# Patient Record
Sex: Male | Born: 1955 | Race: White | Hispanic: No | Marital: Married | Smoking: Never smoker
Health system: Southern US, Community
[De-identification: ages and names within clinical notes are randomized; demographics above are authoritative.]

## PROBLEM LIST (undated history)

## (undated) DIAGNOSIS — I10 Essential (primary) hypertension: Secondary | ICD-10-CM

## (undated) DIAGNOSIS — I2510 Atherosclerotic heart disease of native coronary artery without angina pectoris: Secondary | ICD-10-CM

## (undated) DIAGNOSIS — T4145XA Adverse effect of unspecified anesthetic, initial encounter: Secondary | ICD-10-CM

## (undated) DIAGNOSIS — T8859XA Other complications of anesthesia, initial encounter: Secondary | ICD-10-CM

## (undated) DIAGNOSIS — M1990 Unspecified osteoarthritis, unspecified site: Secondary | ICD-10-CM

## (undated) DIAGNOSIS — E669 Obesity, unspecified: Secondary | ICD-10-CM

## (undated) DIAGNOSIS — I219 Acute myocardial infarction, unspecified: Secondary | ICD-10-CM

## (undated) DIAGNOSIS — R7303 Prediabetes: Secondary | ICD-10-CM

## (undated) DIAGNOSIS — I480 Paroxysmal atrial fibrillation: Secondary | ICD-10-CM

## (undated) DIAGNOSIS — E785 Hyperlipidemia, unspecified: Secondary | ICD-10-CM

## (undated) HISTORY — DX: Paroxysmal atrial fibrillation: I48.0

## (undated) HISTORY — DX: Essential (primary) hypertension: I10

## (undated) HISTORY — DX: Atherosclerotic heart disease of native coronary artery without angina pectoris: I25.10

## (undated) HISTORY — PX: ARTHROSCOPY KNEE W/ DRILLING: SUR92

## (undated) HISTORY — DX: Hyperlipidemia, unspecified: E78.5

## (undated) HISTORY — DX: Obesity, unspecified: E66.9

---

## 2006-02-03 DIAGNOSIS — I219 Acute myocardial infarction, unspecified: Secondary | ICD-10-CM

## 2006-02-03 DIAGNOSIS — I2510 Atherosclerotic heart disease of native coronary artery without angina pectoris: Secondary | ICD-10-CM

## 2006-02-03 HISTORY — DX: Atherosclerotic heart disease of native coronary artery without angina pectoris: I25.10

## 2006-02-03 HISTORY — DX: Acute myocardial infarction, unspecified: I21.9

## 2007-01-19 ENCOUNTER — Inpatient Hospital Stay (HOSPITAL_COMMUNITY): Admission: EM | Admit: 2007-01-19 | Discharge: 2007-01-24 | Payer: Self-pay | Admitting: Emergency Medicine

## 2007-01-20 HISTORY — PX: CARDIAC CATHETERIZATION: SHX172

## 2007-02-04 HISTORY — PX: OTHER SURGICAL HISTORY: SHX169

## 2010-06-18 NOTE — Discharge Summary (Signed)
NAME:  OLUWAFERANMI, WAIN                   ACCOUNT NO.:  [id number]   MEDICAL RECORD NO.:  [id number]          PATIENT TYPE:  INP   LOCATION:  3712                         FACILITY:  [facility]   PHYSICIAN:  Darlin Priestly, MD  DATE OF BIRTH:  08/05/1955   DATE OF ADMISSION:  01/19/2007  DATE OF DISCHARGE:  01/24/2007                               DISCHARGE SUMMARY   Mr. Lyfe Monger is a 55 year old male patient who went to his primary MD  after having two episodes of chest pain on Sunday and Monday, left-  sided.  He also had severe pain in his left shoulder, upper arm, he was  nauseated and shortness of breath and had hot flash sensations.  He was  transferred to [facility] because all his cardiac enzymes were elevated  for further Cardiology workup.  He was seen by Dr. Lynnea Ferrier and admitted,  put on IV heparin with plans for cardiac catheterization.  He underwent  cardiac catheterization on January 20, 2007.  He had a total RCA with  left to right collaterals.  It was thought that he should have an  attempt at PCI.  He also had a 30% LAD.  Dr. Lenise Herald attempted  PCI; however, he was unable to cross the lesion and so no PCI was done.  Medical therapy was recommended.  Over the next following days he was  seen by cardiac rehab.  His medications were adjusted.  He has a couple  of runs of atrial fib, they were not prolonged.  He was then placed on  Cardizem and his metoprolol was increased.  On January 24, 2007, he was  considered stable for discharge home, his blood pressure was 110/80,  heart rate 82, respirations 20, temperature is 98.4, O2 sats were 93%.  His labs showed a hemoglobin of 13.2, hematocrit 38, platelets 271,000,  WBCs 10.  His sodium was 136, potassium 3.8, BUN was 16, creatinine was  1.12, glucose was 117.  CK MB #1 was 279/30.5, #2 was 210/22, #3 was  171/18, #4 was 132/12.6, #5 104/9.3, and #6 was 93/8.  His troponin #1  was 6.52, #2 was 6.81, #3 was 7.25, #4 was 7.05,  #5 was 4.66, #6 was  3.72.  His total cholesterol was 178, LDL was 131, HDL was 29 and  triglycerides were 92.  His glycosylated hemoglobin was 6,  TSH was  2.915.  Chest x-Stacie showed linear scarring vs. subsegmental atelectasis  at the left base, otherwise negative exam.   DISCHARGE MEDICATIONS:  1. Plavix 75 mg a day.  2. Altace 2.5 mg a day.  3. Omega 3 Fish Oil capsules 3 per day.  4. Lipitor 80 mg a day.  5. Metoprolol 50 mg twice per day.  6. Diltiazem ER at 180 mg a day.  7. He should increase his aspirin to 81 mg two a day.  8. Nitroglycerin one under tongue when needed for chest pain.   He was told about cardiac rehab, he plans to attend in [facility]   DISCHARGE DIAGNOSES:  1. Out of hospital  myocardial infarction.  2. Failed attempt at percutaneous coronary intervention to his right      coronary artery which was totaled.  He did have left-to-right      collaterals.  3. Hyperlipidemia.  4. Short episodes of paroxysmal atrial fibrillation.  5. Normal left ventricular function.  6. Hypertension.      [name], N.P.      Darlin Priestly, MD  Electronically Signed    BB/MEDQ  D:  01/24/2007  T:  01/25/2007  Job:  [id number]   cc:   Wannetta Sender.

## 2010-06-18 NOTE — Cardiovascular Report (Signed)
NAME:  Chris Wilcox, Chris Wilcox                   ACCOUNT NO.:  [id number]   MEDICAL RECORD NO.:  [id number]          PATIENT TYPE:  INP   LOCATION:  2924                         FACILITY:  [facility]   PHYSICIAN:  Ritta Slot, MD     DATE OF BIRTH:  [birth year]   DATE OF PROCEDURE:  01/20/2007  DATE OF DISCHARGE:                            CARDIAC CATHETERIZATION   SURGEON:  Ritta Slot, MD.   PROCEDURE:  1. Retrograde left heart catheterization.  2. Left ventriculography.  3. Selective visualization of the coronary arteries.   INDICATIONS FOR PROCEDURE:  This is a 55 year old gentleman with  stuttering left arm pain which persisted until several hours ago. He  came to [facility] emergency room yesterday where he underwent ECG that  showed inferior Q waves with ST segment depression inferolateral and  cardiac enzymes positive for an ACS. Although he remains asymptomatic  and pain free, he was brought to the [facility] cardiac catheterization  laboratory for further delineation of coronary angiography.   He is brought to the second floor of the [facility] hospital to the  cardiac catheterization laboratory. He was prepped and draped in the  usual sterile fashion. The right groin was shaved and clean ready for  access. Approach from the right femoral artery.  I used 20 mL of 1%  lidocaine to infiltrate around the right groin. Sedation used was  4 mg  of Versed and 50 mcg of fentanyl. The right femoral artery was accessed  using a Cook needle and the modified Seldinger technique. A J wire was  was passed Walworth wire into the descending aorta. The Cook needle was  removed over the J wire. A 6-French sheath was placed over the J wire in  the RFA. Over the J wire was into the cook needle and over the J wire  was passed the JL4 catheter. The JL4 catheter was engaged into the left  coronary artery. Visualization of the left coronary artery was taken in  the LAO cranial, LAO caudal, RAO caudal, RAO  cranial view. The JL4  catheter was then exchanged over the J wire for the JL4 catheter. The  JL4 catheter was then engaged into the right coronary artery without  difficulty. The right coronary artery was visualized in the RAO cranial  and LAO cranial views. The JL4 catheter was then exchanged over the J  wire for an angled pigtail. The angled pigtail was placed over the J  wire and traversed across the aortic valve into the left ventricle  without difficulty. Views of the left ventricle were taken in the RAO  oblique 45 degree view with 40 mL of contrast at 13 mL per second.   COMPLICATIONS:  None.   FINDINGS:  Pressures:  Aortic pressure was 108/70/89, LV pressure  104/9/20, PBA was 112/72/92, PDD was 109/13/20. There was no gradient  acoross the Ao Valve by pullback technique.   CORONARY ARTERIES:  1. The left main coronary artery was short and gave rise to an LAD, a      ramus and circumflex. There was no disease  in the left main.  2. The anterior descending artery was large, long and wrapped around      the apex. There was mild disease around 30% stenosis which was      diffuse in the proximal part of the LAD. The LAD gave rise to two      diagonals, there is no disease in the diagonal. The ramus      intermedius branch was free of any disease. The circumflex artery      was medium sized vessel that gave rise to a couple of OMs. There      was no significant disease in the circumflex system. The right      coronary artery had a proximal 100% occlusion and gave rise to the      posterolateral artery. There were collaterals to the right coronary      artery from the LAD.  3. Left ventricular function. LV function was essentially normal with      mild inferoposterior basal hypokinesis. EF was 55%.   IMPRESSION/PLAN:  100% occluded proximal artery with 30% proximal LAD  disease.   PLAN:  The patient will be considered for PCI to the RCA as it is  thought to be an acute recent  infarct although collaterals from left to  right do exist. The films were reviewed and discussed with Dr. Jenne Campus  who agrees to proceed with PCI.      Ritta Slot, MD  Electronically Signed     HS/MEDQ  D:  01/20/2007  T:  01/21/2007  Job:  [id number]

## 2010-06-18 NOTE — Cardiovascular Report (Signed)
NAME:  RYSHAWN, SANZONE                   ACCOUNT NO.:  [id number]   MEDICAL RECORD NO.:  [id number]          PATIENT TYPE:  INP   LOCATION:  2924                         FACILITY:  [facility]   PHYSICIAN:  Darlin Priestly, MD  DATE OF BIRTH:  05/26/1955   DATE OF PROCEDURE:  01/20/2007  DATE OF DISCHARGE:                            CARDIAC CATHETERIZATION   PROCEDURE:  Unsuccessful PCI of the RCA.   SURGEON:  Darlin Priestly, MD   COMPLICATIONS:  None.   INDICATIONS:  Ms. Emberton is a 55 year old male patient with a history of  substernal chest pain which had been going on and off for the last 3  days.  The patient was subsequently seen by his primary care physician  where he wound up having a CK-MB and troponin drawn which revealed a CK  of 370, MB of 48 and a troponin of 10.  He was socially admitted to  [facility] where he underwent cardiac catheterization today by  Dr. Ritta Slot revealing noncritical disease of the proximal LAD  with extensive left-to-right collaterals.  The RCA was noted to be  totally occluded at its mid portion.  He is now for an attempted PCI of  the RCA.   DESCRIPTION OF PROCEDURE:  After obtaining informed written consent,  __________.  A JR-4 6-French guiding catheter with side holes __________  was engaged in the right coronary ostium.  We then attempted to cross  the mid RCA stenotic lesion using a Prowater guidewire, though we were  unsuccessful in passing this beyond the total occlusion.  We then used a  PT 2  Graphix guidewire which was able to successfully cross  approximately 2-3 cm beyond the total occlusion though were unsure  whether this was in the true lumen.  Ultimately this wire was exchanged  for a 0.014 whisper wire which again was able to cross through the total  occlusion though we were uncertain whether this was in the true lumen.  We were unable to visualize any of the distal vessel.  Given the  patient's extensive collateral  bed and asymptomatic, we opted to abandon  the procedure with medical therapy.  IV Integrilin was used throughout  the case. The patient was enrolled in the champion study using p.o.  Plavix versus IV Cangrelor.   CONCLUSION:  1. Unsuccessful percutaneous intervention of the mid RCA total      occlusion.  The patient is noted to have extensive left-to-right      collaterals.  2. Adjuvant use of Integrilin infusion.  3. Enrollment of the champion study (Plavix versus IV Cangrelor).      Darlin Priestly, MD  Electronically Signed     RHM/MEDQ  D:  01/20/2007  T:  01/21/2007  Job:  [id number]

## 2010-06-21 NOTE — Discharge Summary (Signed)
NAME:  Chris Wilcox, Chris Wilcox                   ACCOUNT NO.:  [id number]   MEDICAL RECORD NO.:  [id number]          PATIENT TYPE:  INP   LOCATION:  3712                         FACILITY:  [facility]   PHYSICIAN:  Ritta Slot, MD     DATE OF BIRTH:  1955/08/27   DATE OF ADMISSION:  01/19/2007  DATE OF DISCHARGE:  01/24/2007                               DISCHARGE SUMMARY   HISTORY OF PRESENT ILLNESS:  Chris Wilcox is a 55 year old male who  presented to his primary doctor after he had two episodes of chest pain  several days earlier.  He described it as left-sided chest pain that  felt like an ache.  He also had pain in his left shoulder in the upper  arm.  He was nauseated, dyspneic and had flash sensations. His wife  called and got an appointment with his primary M.D. There he was seen  and blood work showed increased CK of 377, CK-MB was 48.7, troponin was  10.99.  He was transferred to [facility] for further cardiology workup,  thus he had an out of hospital MI, probably on January 17, 2007. He was  seen and admitted and put on IV heparin with plans for cardiac  catheterization. This was performed on January 20, 2007, by Dr.  Lynnea Ferrier. He was found to have a total RCA, 30% in his LAD.  He did have  some left to right collaterals. Dr. Lenise Herald then attempted to  open up the RCA.  This was not able to be done.  Thus, the procedure was  aborted. Medical treatment was recommended including aspirin, Plavix and  beta-blocker.  Over the next several days, his medications were  titrated. He was seen by cardiac rehab. He did have a short run of  atrial fib.  His metoprolol was increased and at the time of discharge  he had no further A-Fib.  On the day of discharge his blood pressure is  110/80, heart rate 82, respirations 20, temperature is 98.4, O2 sats  were 93%.   DISCHARGE MEDICATIONS:  1. Plavix 75 mg a day.  2. Altace 2.5 mg a day.  3. Omega-3 fish oil      capsules, 3 per day.  4. Lipitor 80  mg a day.  5. Metoprolol 50 mg      twice per day.  6. Diltiazem ER 180 mg a day.  7. He should      increase his aspirin to 81 mg, two per day.  8. Nitroglycerin      p.r.n. for chest pain.   DISCHARGE INSTRUCTIONS:  He should do no strenuous activity, lifting,  pushing, pulling for 1 week.  Our office will call with an appointment.  If he has any problems with his groin, he will give our office a call.   LABORATORY STUDIES:  Hemoglobin 13.2, hematocrit 38, platelets were 271,  WBCs were 10, sodium 136, potassium 3.8, BUN 16, creatinine 1.12,  glucose 117, total protein was 5.7, albumin was 2.8, AST was 22, ALT was  28.  CK-MB #1) 279/30, troponin of  6.52. #2) 210/22, troponin of 6.81.  #3) 171/18.4, troponin of 7.25.  #4)  132/12.6,  troponin of 7.05.  #5)  104/7.3, troponin of 4.66, #6) 93/08, troponin of 3.72, total  cholesterol is 178, triglycerides 92, HDL of 29 and LDL was 131.  His  TSH was 2.915.   DIAGNOSTIC STUDIES:  Chest x-Ihan showed linear scarring with  subsegmental atelectasis at the left base, otherwise negative exam.   DISCHARGE DIAGNOSIS:  1. Out of hospital myocardial infarction.  2. Coronary artery disease      status post cath with RCA totaled and a 30%      in his LAD. Attempted to do a PCI, however, this failed. He did      have left-to-right collaterals so he was medically treated.  3.      Normal ejection fraction.  4.  Hyperlipidemia.  5. Hypertension.      6. Episode of paroxysmal atrial fibrillation.      [name], N.P.      Ritta Slot, MD  Electronically Signed    BB/MEDQ  D:  02/22/2007  T:  02/23/2007  Job:  [id number]   cc:   Wannetta Sender.

## 2010-11-08 LAB — CBC
HCT: 35.4 — ABNORMAL LOW
HCT: 35.9 — ABNORMAL LOW
HCT: 36.8 — ABNORMAL LOW
HCT: 38.7 — ABNORMAL LOW
Hemoglobin: 12.7 — ABNORMAL LOW
Hemoglobin: 12.9 — ABNORMAL LOW
Hemoglobin: 13.2
Hemoglobin: 13.5
MCHC: 34.7
MCHC: 34.8
MCHC: 34.9
MCHC: 35.5
MCV: 83.3
MCV: 83.9
MCV: 84.6
MCV: 85.4
MCV: 85.4
Platelets: 192
Platelets: 196
Platelets: 199
Platelets: 214
Platelets: 235
RBC: 4.27
RBC: 4.32
RBC: 4.39
RBC: 4.45
RBC: 4.56
RBC: 4.84
RDW: 13.2
RDW: 13.3
RDW: 13.4
RDW: 13.6
WBC: 10
WBC: 11.4 — ABNORMAL HIGH
WBC: 9.2
WBC: 9.8

## 2010-11-08 LAB — BASIC METABOLIC PANEL
BUN: 15
CO2: 23
CO2: 25
Calcium: 8.3 — ABNORMAL LOW
Calcium: 8.9
Chloride: 103
Chloride: 103
Chloride: 107
Creatinine, Ser: 0.93
Creatinine, Ser: 1.16
GFR calc Af Amer: >60
GFR calc Af Amer: >60
GFR calc Af Amer: >60
GFR calc non Af Amer: >60
GFR calc non Af Amer: >60
Glucose, Bld: 139 — ABNORMAL HIGH
Potassium: 3.8
Potassium: 3.8
Sodium: 135
Sodium: 136

## 2010-11-08 LAB — CARDIAC PANEL(CRET KIN+CKTOT+MB+TROPI)
CK, MB: 12.6 — ABNORMAL HIGH
CK, MB: 18.4 — ABNORMAL HIGH
CK, MB: 22.6 — ABNORMAL HIGH
CK, MB: 30.5 — ABNORMAL HIGH
CK, MB: 9.3 — ABNORMAL HIGH
Relative Index: 10.8 — ABNORMAL HIGH
Relative Index: 10.9 — ABNORMAL HIGH
Relative Index: 8.9 — ABNORMAL HIGH
Relative Index: 9.5 — ABNORMAL HIGH
Total CK: 104
Total CK: 132
Total CK: 171
Total CK: 279 — ABNORMAL HIGH
Troponin I: 3.72
Troponin I: 4.66
Troponin I: 6.52
Troponin I: 7.05
Troponin I: 7.25

## 2010-11-08 LAB — COMPREHENSIVE METABOLIC PANEL
ALT: 29
ALT: 30
AST: 31
AST: 54 — ABNORMAL HIGH
Albumin: 2.8 — ABNORMAL LOW
Albumin: 2.8 — ABNORMAL LOW
Alkaline Phosphatase: 71
Alkaline Phosphatase: 75
BUN: 13
BUN: 16
CO2: 25
Calcium: 8.2 — ABNORMAL LOW
Calcium: 8.3 — ABNORMAL LOW
Chloride: 102
Chloride: 104
Chloride: 105
Creatinine, Ser: 1.02
Creatinine, Ser: 1.06
Creatinine, Ser: 1.09
GFR calc Af Amer: >60
GFR calc non Af Amer: >60
GFR calc non Af Amer: >60
GFR calc non Af Amer: >60
Glucose, Bld: 128 — ABNORMAL HIGH
Potassium: 3.4 — ABNORMAL LOW
Potassium: 3.7
Sodium: 138
Total Bilirubin: 1.1
Total Bilirubin: 1.1
Total Bilirubin: 1.5 — ABNORMAL HIGH
Total Protein: 5.8 — ABNORMAL LOW
Total Protein: 6

## 2010-11-08 LAB — DIFFERENTIAL
Basophils Absolute: 0.1
Basophils Relative: 0
Basophils Relative: 0
Eosinophils Absolute: 0 — ABNORMAL LOW
Eosinophils Absolute: 0.2
Eosinophils Relative: 0
Lymphocytes Relative: 12
Lymphs Abs: 1.5
Lymphs Abs: 1.7
Monocytes Absolute: 0.7
Monocytes Absolute: 1
Monocytes Relative: 7
Monocytes Relative: 8
Neutro Abs: 10.3 — ABNORMAL HIGH
Neutrophils Relative %: 80 — ABNORMAL HIGH

## 2010-11-08 LAB — TSH: TSH: 2.915

## 2010-11-08 LAB — I-STAT 8, (EC8 V) (CONVERTED LAB)
Acid-Base Excess: 1
BUN: 20
Bicarbonate: 25.9 — ABNORMAL HIGH
Chloride: 105
HCT: 43
Hemoglobin: 14.6
Potassium: 3.7
Sodium: 136
TCO2: 27
pCO2, Ven: 39.3 — ABNORMAL LOW
pH, Ven: 7.427 — ABNORMAL HIGH

## 2010-11-08 LAB — HEPARIN LEVEL (UNFRACTIONATED)
Heparin Unfractionated: 0.37
Heparin Unfractionated: 0.46

## 2010-11-08 LAB — POCT I-STAT CREATININE: Creatinine, Ser: 1.1

## 2010-11-08 LAB — POCT CARDIAC MARKERS
CKMB, poc: 8.3
Myoglobin, poc: 86.5
Operator id: 288831

## 2010-11-08 LAB — PROTIME-INR: INR: 1

## 2010-11-08 LAB — HEMOGLOBIN A1C: Mean Plasma Glucose: 136

## 2010-11-08 LAB — LIPID PANEL
HDL: 29 — ABNORMAL LOW
LDL Cholesterol: 131 — ABNORMAL HIGH
Total CHOL/HDL Ratio: 6.1

## 2010-11-08 LAB — APTT: aPTT: 35

## 2011-03-07 DIAGNOSIS — I480 Paroxysmal atrial fibrillation: Secondary | ICD-10-CM

## 2011-03-07 HISTORY — DX: Paroxysmal atrial fibrillation: I48.0

## 2011-03-13 HISTORY — PX: NM MYOCAR PERF WALL MOTION: HXRAD629

## 2011-06-10 HISTORY — PX: DOPPLER ECHOCARDIOGRAPHY: SHX263

## 2012-07-12 ENCOUNTER — Other Ambulatory Visit: Payer: Self-pay | Admitting: Cardiology

## 2012-10-01 DIAGNOSIS — R319 Hematuria, unspecified: Secondary | ICD-10-CM | POA: Insufficient documentation

## 2012-10-10 ENCOUNTER — Encounter: Payer: Self-pay | Admitting: *Deleted

## 2012-10-21 ENCOUNTER — Ambulatory Visit (INDEPENDENT_AMBULATORY_CARE_PROVIDER_SITE_OTHER): Payer: BC Managed Care – PPO | Admitting: Cardiology

## 2012-10-21 ENCOUNTER — Encounter: Payer: Self-pay | Admitting: Cardiology

## 2012-10-21 VITALS — BP 130/80 | HR 71 | Ht 72.0 in | Wt 230.0 lb

## 2012-10-21 DIAGNOSIS — I4891 Unspecified atrial fibrillation: Secondary | ICD-10-CM

## 2012-10-21 DIAGNOSIS — E1169 Type 2 diabetes mellitus with other specified complication: Secondary | ICD-10-CM | POA: Insufficient documentation

## 2012-10-21 DIAGNOSIS — E669 Obesity, unspecified: Secondary | ICD-10-CM | POA: Insufficient documentation

## 2012-10-21 DIAGNOSIS — I219 Acute myocardial infarction, unspecified: Secondary | ICD-10-CM

## 2012-10-21 DIAGNOSIS — E785 Hyperlipidemia, unspecified: Secondary | ICD-10-CM

## 2012-10-21 DIAGNOSIS — I10 Essential (primary) hypertension: Secondary | ICD-10-CM

## 2012-10-21 DIAGNOSIS — Z01810 Encounter for preprocedural cardiovascular examination: Secondary | ICD-10-CM

## 2012-10-21 DIAGNOSIS — I4821 Permanent atrial fibrillation: Secondary | ICD-10-CM | POA: Insufficient documentation

## 2012-10-21 DIAGNOSIS — I2510 Atherosclerotic heart disease of native coronary artery without angina pectoris: Secondary | ICD-10-CM | POA: Insufficient documentation

## 2012-10-21 NOTE — Assessment & Plan Note (Signed)
Chris Wilcox has known coronary disease but is not revascularized or revascularizable.  He is currently asymptomatic with no anginal symptoms or heart failure symptoms.  His blood pressure is well-controlled and he is nondiabetic/not on insulin.  PREOPERATIVE CARDIAC RISK ASSESSMENT   Revised Cardiac Risk Index:  High Risk Surgery: no; knee surgery  Defined as Intraperitoneal, intrathoracic or suprainguinal vascular  Active CAD: no; known occlusion of the RCA but no symptoms  CHF: no; no PND orthopnea or edema  Cerebrovascular Disease: no;   Diabetes: borderline; On Insulin: no -- only counts as a risk that 2 if on insulin  CKD (Cr >~ 2): no;  Total: 1 Estimated Risk of Adverse Outcome: LOW RISK  Estimated Risk of MI, PE, VF/VT (Cardiac Arrest), Complete Heart Block: <0.9 % --> reduced to less than 0.5% with the use of long-term beta blocker.   ACC/AHA Guidelines for "Clearance":  Step 1 - Need for Emergency Surgery: No:   If Yes - go straight to OR with perioperative surveillance  Step 2 - Active Cardiac Conditions (Unstable Angina, Decompensated HF, Significant  Arrhytmias - Complete HB, Mobitz II, Symptomatic VT or SVT, Severe Aortic Stenosis - mean gradient > 40 mmHg, Valve area < 1.0 cm2):   No: No active symptoms is not atrial fibrillation.  If Yes - Evaluate & Treat per ACC/AHA Guidelines  Step 3 -  Low Risk Surgery: Yes  If Yes --> proceed to OR  If No --> Step 4  Step 4 - Functional Capacity >= 4 METS without symptoms: Yes  If Yes --> proceed to OR  If No --> Step 5     Proceed to OR with HR control, and no further cardiac evaluation Based on these guidelines and risk calculator his: He would be a low risk patient for a low risk procedure.  I would recommend continuing the beta blocker and calcium blocker perioperatively to avoid tachycardia/rapid atrial fibrillation.  He would need to hold Xarelto for 3 days preoperatively and restart within the first day  if not the next day postop.

## 2012-10-21 NOTE — Assessment & Plan Note (Addendum)
Relatively well controlled at the upper end of normal for a diabetic.  He is on a good dose of ARB beta blocker and calcium channel blocker.

## 2012-10-21 NOTE — Assessment & Plan Note (Signed)
No active anginal symptoms.  His been doing relatively well since his diagnosis back in 2008.  He is stable on low-dose aspirin, beta blocker and ARB.  He is also on statin for risk modification.

## 2012-10-21 NOTE — Progress Notes (Signed)
PCP: Lindwood Qua, MD  Clinic Note: Chief Complaint  Patient presents with  . pre op clearance    need surgery on left knee, no chest pain , no sob , no swelling    HPI: Chris Wilcox is a 57 y.o. male with a PMH below who presents today for early followup for preoperative risk evaluation for knee surgery. He is a very pleasant gentleman who I last saw back in January of this year.  He had a history of acute coronary syndrome doesn't 8 and was found to have a totally occluded RCA with an unsuccessful attempt for PCI.  There were left to right collaterals.  This is essentially confirmed on stress test in February 2013 which showed a moderate perfusion defect in the basal inferior admitted for a wall with minimal reversibility to be consistent with occluded RCA with collateral flow. He also has chronic/recurrent paroxysmal atrial fibrillation with a CHADS2-VASC2 score of 2.  He is in the toilet on Xarelto and is rate controlled with combination of beta blocker and calcium channel blocker.  He is relatively asymptomatic of his atrial fibrillation.  Interval History: He presents today for preoperative evaluation for planned knee surgery on his left knee.  This has limited his ability to exercise and has become quite painful.  Dr. Despina Hick planned operative November.  I felt that it had been too long since I last seen him in her to accurately assess his clinical status. He denies any sensation of palpitations or rapid heartbeats.  He denies any chest pain/pressure or shortness of breath with rest or exertion.  No PND, orthopnea or edema.  No melena, hematochezia or hematuria.  No lightheadedness/dizziness, wooziness, 60/near-syncope.  No TIA or amaurosis fugax symptoms.  No nosebleeds.  He is able to do most activities without any symptoms, only being limited by his knee pain now.  He is easily able to do more than 8 metabolic once once his knee is better.  Past Medical History  Diagnosis Date  .  Coronary artery disease, occlusive 2008    CATH -- CTO OF RCA - unable to cross for PCI; L-R collateralization.   Marland Kitchen PAF (paroxysmal atrial fibrillation) 03/13/2011-04/11/2011    ATRIAL FIB wore Event MONITOR;  with CHADS/VASC score of 2 on anticoagulation  . Hypertension     well controlled  . Hyperlipidemia     on statin montiored by PCP  . Obesity     Prior Cardiac Evaluation and Past Surgical History: Past Surgical History  Procedure Laterality Date  . Doppler echocardiography  06/10/2011    EF =>55% -ATRIAL FIB,BORDERLINE BILEAFLET MITRAL VALVE PROLAPSE,MILD TO MOD MITARL INSUFF;MILD AORTIC INSUFF  . Nm myocar perf wall motion  03/13/2011    LEXISCAN--EF 55% LV MILDLY REDUCED;BASAL INFERIOR AND MID INFERIOR SCAR  . Cardiac catheterization  01/20/2007    unsuccessful PCI of RCA    Allergies  Allergen Reactions  . Codeine Nausea Only    Current Outpatient Prescriptions  Medication Sig Dispense Refill  . aspirin EC 81 MG tablet Take 81 mg by mouth daily.      Marland Kitchen atorvastatin (LIPITOR) 40 MG tablet Take 40 mg by mouth daily.      Marland Kitchen diltiazem (DILT-XR) 240 MG 24 hr capsule Take 240 mg by mouth daily.      . Glucosamine 500 MG CAPS Take by mouth. 2 tablet      . losartan (COZAAR) 50 MG tablet Take 50 mg by mouth daily.      Marland Kitchen  metoprolol (LOPRESSOR) 50 MG tablet Take 1/2 tablet by mouth twice a day      . naproxen sodium (ANAPROX) 220 MG tablet Take 4 tablets by mouth a day      . nitroGLYCERIN (NITROSTAT) 0.4 MG SL tablet Place 0.4 mg under the tongue every 5 (five) minutes as needed for chest pain.      . Omega-3 Fatty Acids (FISH OIL) 1000 MG CAPS Take by mouth. Take 3 capsules by mouth a day      . XARELTO 20 MG TABS TAKE 1 TABLET BY MOUTH EVERY DAY  30 tablet  5   No current facility-administered medications for this visit.    History   Social History Narrative   Married father of 2   Until his knee started giving him problems he was exercising at least 15-20 minutes a  day 3-4 times a week.  Currently now is really limited right knee pain.  He is able to ride the bicycle for about 10-15 minutes and then needs to stop.   He denies any smoking or drinking history   ROS: A comprehensive Review of Systems - Endocrine ROS: He is very concerned he had elevated blood sugar levels and hemoglobin A1c of 6.7.  This is borderline diabetes for him.  With this in mind he is very dramatic changes to his diet.  He has reduced his carbohydrate and fat intake in the last couple weeks he has lost 4 pounds.  Musculoskeletal ROS: positive for - joint pain, joint stiffness and joint swelling - left knee  PHYSICAL EXAM BP 130/80  Pulse 71  Ht 6' (1.829 m)  Wt 230 lb (104.327 kg)  BMI 31.19 kg/m2 General appearance: alert, cooperative, appears stated age, no distress, mildly obese and Well-nourished and well-groomed.  Answers questions appropriately. Neck: no adenopathy, no carotid bruit, no JVD and supple, symmetrical, trachea midline Lungs: clear to auscultation bilaterally, normal percussion bilaterally and Nonlabored with good air movement Heart: irregularly irregular rhythm, S1, S2 normal, no S3 or S4 and No murmurs or rubs Abdomen: soft, non-tender; bowel sounds normal; no masses,  no organomegaly Extremities: extremities normal, atraumatic, no cyanosis or edema, no edema, redness or tenderness in the calves or thighs, no ulcers, gangrene or trophic changes and Tenderness along the left knee Pulses: 2+ and symmetric Neurologic: Alert and oriented X 3, normal strength and tone. Normal symmetric reflexes. Normal coordination and gait Mental status: Alert, oriented, thought content appropriate HEENT: [place]/AT, EOMI, MMM, anicteric sclera  [id number] today: Yes Rate: 71 , Rhythm: Atrial fibrillation, septal MI, age undetermined.  No significant change.  Recent Labs: November 2013: TC 143, TG 71, HDL 36, LDL 93. -->  Due for recheck in the next couple months.  ASSESSMENT /  PLAN: Coronary artery disease, occlusive - Knonw RCA Occlusion No active anginal symptoms.  His been doing relatively well since his diagnosis back in 2008.  He is stable on low-dose aspirin, beta blocker and ARB.  He is also on statin for risk modification.  AF (paroxysmal atrial fibrillation) Rate controlled with beta blocker and calcium channel blocker - he is unaware of his symptoms within normal heart rate.  He is on Xarelto for regulation.  HTN (hypertension), benign Relatively well controlled at the upper end of normal for a diabetic.  He is on a good dose of ARB beta blocker and calcium channel blocker.  Obesity (BMI 30-39.9) With his diagnosis of borderline diabetes which basically gives him the diagnosis of metabolic syndrome,  he has made significant changes in his dietary intake.  He is also looking forward to getting back into exercising once his knee is better.  He wants to lose weight, her blood bili, some his medications and not go on an additional medication for diabetes.  Dyslipidemia, goal LDL below 70 He is on statin and followed by his primary physician.  His last labs were quite good, but not yet at goal his HDL is 36 (which is a portion of the middle syndrome diagnosis) and his LDL is not quite helping and 93 with a goal of less than 70.  He is due to get his labs rechecked soon.  We could consider additional medication such as niacin to help with his HDL, I think the best plan for a steel improvement is daily exercise.  Pre-operative cardiovascular examination Mr. Linhardt has known coronary disease but is not revascularized or revascularizable.  He is currently asymptomatic with no anginal symptoms or heart failure symptoms.  His blood pressure is well-controlled and he is nondiabetic/not on insulin.  PREOPERATIVE CARDIAC RISK ASSESSMENT   Revised Cardiac Risk Index:  High Risk Surgery: no; knee surgery  Defined as Intraperitoneal, intrathoracic or suprainguinal  vascular  Active CAD: no; known occlusion of the RCA but no symptoms  CHF: no; no PND orthopnea or edema  Cerebrovascular Disease: no;   Diabetes: borderline; On Insulin: no -- only counts as a risk that 2 if on insulin  CKD (Cr >~ 2): no;  Total: 1 Estimated Risk of Adverse Outcome: LOW RISK  Estimated Risk of MI, PE, VF/VT (Cardiac Arrest), Complete Heart Block: <0.9 % --> reduced to less than 0.5% with the use of long-term beta blocker.   ACC/AHA Guidelines for "Clearance":  Step 1 - Need for Emergency Surgery: No:   If Yes - go straight to OR with perioperative surveillance  Step 2 - Active Cardiac Conditions (Unstable Angina, Decompensated HF, Significant  Arrhytmias - Complete HB, Mobitz II, Symptomatic VT or SVT, Severe Aortic Stenosis - mean gradient > 40 mmHg, Valve area < 1.0 cm2):   No: No active symptoms is not atrial fibrillation.  If Yes - Evaluate & Treat per ACC/AHA Guidelines  Step 3 -  Low Risk Surgery: Yes  If Yes --> proceed to OR  If No --> Step 4  Step 4 - Functional Capacity >= 4 METS without symptoms: Yes  If Yes --> proceed to OR  If No --> Step 5     Proceed to OR with HR control, and no further cardiac evaluation Based on these guidelines and risk calculator his: He would be a low risk patient for a low risk procedure.  I would recommend continuing the beta blocker and calcium blocker perioperatively to avoid tachycardia/rapid atrial fibrillation.  He would need to hold Xarelto for 3 days preoperatively and restart within the first day if not the next day postop.     Orders Placed This Encounter  Procedures  . EKG 12-Lead   Meds ordered this encounter  Medications  . nitroGLYCERIN (NITROSTAT) 0.4 MG SL tablet    Sig: Place 0.4 mg under the tongue every 5 (five) minutes as needed for chest pain.    Followup: January to February 2015  [place] Sloop W. Herbie Baltimore, M.D., M.S. [facility] [address]  [address] [place], [place]  [address]  [phone] Pager # [phone]

## 2012-10-21 NOTE — Assessment & Plan Note (Signed)
He is on statin and followed by his primary physician.  His last labs were quite good, but not yet at goal his HDL is 36 (which is a portion of the middle syndrome diagnosis) and his LDL is not quite helping and 93 with a goal of less than 70.  He is due to get his labs rechecked soon.  We could consider additional medication such as niacin to help with his HDL, I think the best plan for a steel improvement is daily exercise.

## 2012-10-21 NOTE — Assessment & Plan Note (Signed)
Rate controlled with beta blocker and calcium channel blocker - he is unaware of his symptoms within normal heart rate.  He is on Xarelto for regulation.

## 2012-10-21 NOTE — Assessment & Plan Note (Signed)
With his diagnosis of borderline diabetes which basically gives him the diagnosis of metabolic syndrome, he has made significant changes in his dietary intake.  He is also looking forward to getting back into exercising once his knee is better.  He wants to lose weight, her blood bili, some his medications and not go on an additional medication for diabetes.

## 2012-10-21 NOTE — Patient Instructions (Signed)
As we discussed, you would be Low Risk from a Cardiac Perspective -- due to your known Coronary Artery Disease.  The Xarelto can be held 2-3 days pre-op & restart 1-2 days post-op; This is taken daily with the evening meal.  Aspirin needs to be held 5 days pre-op & can start that evening or the next day.  I will see you back in January-February time frame to see how you are doing post-operatively & to get back into the usual routine.  Marykay Lex, MD

## 2012-11-25 NOTE — Progress Notes (Signed)
Need orders when able please - pt coming for preop FRI 12/03/12 - thank you 

## 2012-11-28 ENCOUNTER — Other Ambulatory Visit: Payer: Self-pay | Admitting: Orthopedic Surgery

## 2012-11-29 ENCOUNTER — Encounter (HOSPITAL_COMMUNITY): Payer: Self-pay | Admitting: Pharmacy Technician

## 2012-12-02 ENCOUNTER — Other Ambulatory Visit (HOSPITAL_COMMUNITY): Payer: Self-pay | Admitting: Orthopedic Surgery

## 2012-12-02 NOTE — Patient Instructions (Addendum)
[address] Harcum  12/02/2012   Your procedure is scheduled on: 12-13-2012  Report to [facility] at 600 AM.  Call this number if you have problems the morning of surgery [phone]   Remember: short stay will draw your blood type morning of surgery.   Do not eat food or drink liquids :After Midnight.    Take these medicines the morning of surgery with A SIP OF WATER: diltiazem, metoprolol                                SEE [facility] PREPARING FOR SURGERY SHEET             You may not have any metal on your body including hair pins and piercings  Do not wear jewelry, make-up.  Do not wear lotions, powders, or perfumes. You may wear deodorant.   Men may shave face and neck.  Do not bring valuables to the hospital. [facility] IS NOT RESPONSIBLE FOR VALUEABLES.  Contacts, dentures or bridgework may not be worn into surgery.  Leave suitcase in the car. After surgery it may be brought to your room.  For patients admitted to the hospital, checkout time is 11:00 AM the day of discharge.   Patients discharged the day of surgery will not be allowed to drive home.  Name and phone number of your driver:  Special Instructions: N/A   Please read over the following fact sheets that you were given: mrsa information, blood fact sheet, incentive spirometer sheet  Call Cain Sieve RN pre op nurse if needed [phone]    FAILURE TO FOLLOW THESE INSTRUCTIONS MAY RESULT IN THE CANCELLATION OF YOUR SURGERY.  PATIENT SIGNATURE___________________________________________  NURSE SIGNATURE_____________________________________________

## 2012-12-02 NOTE — Progress Notes (Signed)
Cardiac clearance note dr Herbie Baltimore 10-21-12 epic

## 2012-12-03 ENCOUNTER — Encounter (HOSPITAL_COMMUNITY): Payer: Self-pay

## 2012-12-03 ENCOUNTER — Encounter (HOSPITAL_COMMUNITY)
Admission: RE | Admit: 2012-12-03 | Discharge: 2012-12-03 | Disposition: A | Discharge status: Home / Self Care | Payer: BC Managed Care – PPO | Source: Ambulatory Visit | Attending: Orthopedic Surgery | Admitting: Orthopedic Surgery

## 2012-12-03 ENCOUNTER — Ambulatory Visit (HOSPITAL_COMMUNITY)
Admission: RE | Admit: 2012-12-03 | Discharge: 2012-12-03 | Disposition: A | Discharge status: Home / Self Care | Payer: BC Managed Care – PPO | Source: Ambulatory Visit | Attending: Orthopedic Surgery | Admitting: Orthopedic Surgery

## 2012-12-03 ENCOUNTER — Encounter: Payer: Self-pay | Admitting: Cardiology

## 2012-12-03 DIAGNOSIS — Z01818 Encounter for other preprocedural examination: Secondary | ICD-10-CM | POA: Insufficient documentation

## 2012-12-03 DIAGNOSIS — I10 Essential (primary) hypertension: Secondary | ICD-10-CM | POA: Insufficient documentation

## 2012-12-03 DIAGNOSIS — Z01812 Encounter for preprocedural laboratory examination: Secondary | ICD-10-CM | POA: Insufficient documentation

## 2012-12-03 DIAGNOSIS — IMO0002 Reserved for concepts with insufficient information to code with codable children: Secondary | ICD-10-CM | POA: Insufficient documentation

## 2012-12-03 DIAGNOSIS — M1710 Unilateral primary osteoarthritis, unspecified knee: Secondary | ICD-10-CM | POA: Insufficient documentation

## 2012-12-03 HISTORY — DX: Adverse effect of unspecified anesthetic, initial encounter: T41.45XA

## 2012-12-03 HISTORY — DX: Other complications of anesthesia, initial encounter: T88.59XA

## 2012-12-03 HISTORY — DX: Unspecified osteoarthritis, unspecified site: M19.90

## 2012-12-03 HISTORY — DX: Acute myocardial infarction, unspecified: I21.9

## 2012-12-03 HISTORY — DX: Prediabetes: R73.03

## 2012-12-03 LAB — COMPREHENSIVE METABOLIC PANEL
ALT: 19 U/L (ref 0–53)
Alkaline Phosphatase: 93 U/L (ref 39–117)
BUN: 26 mg/dL — ABNORMAL HIGH (ref 6–23)
CO2: 27 mEq/L (ref 19–32)
Chloride: 102 mEq/L (ref 96–112)
GFR calc Af Amer: 85 mL/min — ABNORMAL LOW (ref >90)
Glucose, Bld: 126 mg/dL — ABNORMAL HIGH (ref 70–99)
Potassium: 3.8 mEq/L (ref 3.5–5.1)
Total Bilirubin: 0.8 mg/dL (ref 0.3–1.2)

## 2012-12-03 LAB — URINALYSIS, ROUTINE W REFLEX MICROSCOPIC
Bilirubin Urine: NEGATIVE
Glucose, UA: NEGATIVE mg/dL
Nitrite: NEGATIVE
Protein, ur: NEGATIVE mg/dL
Urobilinogen, UA: 1 mg/dL (ref 0.0–1.0)
pH: 6.5 (ref 5.0–8.0)

## 2012-12-03 LAB — APTT: aPTT: 31 seconds (ref 24–37)

## 2012-12-03 LAB — CBC
HCT: 42.6 % (ref 39.0–52.0)
Hemoglobin: 15.4 g/dL (ref 13.0–17.0)
MCHC: 36.2 g/dL — ABNORMAL HIGH (ref 30.0–36.0)
RBC: 5.01 MIL/uL (ref 4.22–5.81)
WBC: 7.1 10*3/uL (ref 4.0–10.5)

## 2012-12-03 LAB — PROTIME-INR: Prothrombin Time: 15 seconds (ref 11.6–15.2)

## 2012-12-03 LAB — SURGICAL PCR SCREEN
MRSA, PCR: INVALID — AB
Staphylococcus aureus: INVALID — AB

## 2012-12-03 LAB — URINE MICROSCOPIC-ADD ON

## 2012-12-03 NOTE — Progress Notes (Signed)
Dr Herbie Baltimore office unable to find 10-21-12 ekg and not in epic, ekg repeated for 12-03-12 pre op visit.

## 2012-12-03 NOTE — Progress Notes (Signed)
Ua, micro, cmet results faxed to dr Lequita Halt by epic

## 2012-12-06 LAB — MRSA CULTURE

## 2012-12-12 ENCOUNTER — Encounter (HOSPITAL_COMMUNITY): Payer: Self-pay | Admitting: Anesthesiology

## 2012-12-12 ENCOUNTER — Other Ambulatory Visit: Payer: Self-pay | Admitting: Orthopedic Surgery

## 2012-12-12 NOTE — H&P (Signed)
Chris Wilcox   DOB: [birth year]  Married / Language: English / Race: White  Male  Date of Admission: 12/13/2012   Chief Complaint: Left Knee Pain   History of Present Illness  The patient is a 57 year old male who comes in for a preoperative History and Physical. The patient is scheduled for a left total knee arthroplasty to be performed by Dr. Gus Rankin. Aluisio, MD at [facility] on 12/13/2012.  The patient is a 57 year old male who presents today for follow up of their knee. The patient is being followed for their left knee pain and osteoarthritis. They are now out from the last visit. Symptoms reported today include: pain. He wants to proceed with surgery.  The knee is bothering him at all times. It is limiting what he can and can not do. The knee wants to give out on him at times. He will get sharp pains in the knee. He has some right knee pain but no where near as bad as the left. He is not interested in injections as he wants a more permanent fix of this and also because his function is the thing that is really worse than anything right now and the injection will not help that. He is ready to proceed with the knee replacement.  They have been treated conservatively in the past for the above stated problem and despite conservative measures, they continue to have progressive pain and severe functional limitations and dysfunction. They have failed non-operative management including home exercise, medications, and injections. It is felt that they would benefit from undergoing total joint replacement. Risks and benefits of the procedure have been discussed with the patient and they elect to proceed with surgery. There are no active contraindications to surgery such as ongoing infection or rapidly progressive neurological disease.   Problem List  Primary osteoarthritis of one knee (715.16)   Allergies  Codeine Derivatives. Nausea.   Medications:  Aspirin 81 mg  Atorvastatin 40  mg Diltiazem XR 240 mg Glucosamine 500 mg Losartan 50 mg Metoprolol 50 mg Naproxen 220 mg Nitroglycerin 0.4 mg SL Omega 3 Fish Oil 1000 mg Xarelto 20 mg  Family History  Depression. First Degree Relatives. father  Rheumatoid Arthritis. sister  Diabetes Mellitus. father and grandfather fathers side  Cancer. 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 greater than 5  Marital status. married  Illicit drug use. no  Living situation. live with spouse  Pain Contract. no  Tobacco use. Never smoker. never smoker  Previously in rehab. no  Exercise. Exercises daily; does team sport  Current work status. working full time  Drug/Alcohol Rehab (Currently). no  Children. 2  Alcohol use. never consumed alcohol  Past Surgical History  Arthroscopy of Knee. left   Medical History  Coronary artery disease  Myocardial infarction. 2008  Hypertension  Atrial Fibrillation. Chronic  Hyperlipidemia. Dyslipidemia  Cardiac Arrhythmia   Review of Systems  General: Not Present- Chills, Fever, Night Sweats, Fatigue, Weight Gain, Weight Loss and Memory Loss.  Skin: Not Present- Hives, Itching, Rash, Eczema and Lesions.  HEENT: Not Present- Tinnitus, Headache, Double Vision, Visual Loss, Hearing Loss and Dentures.  Respiratory: Not Present- Shortness of breath with exertion, Shortness of breath at rest, Allergies, Coughing up blood and Chronic Cough.  Cardiovascular: Not Present- Chest Pain, Racing/skipping heartbeats, Difficulty Breathing Lying Down, Murmur, Swelling and Palpitations.  Gastrointestinal: Not  Present- Bloody Stool, Heartburn, Abdominal Pain, Vomiting, Nausea, Constipation, Diarrhea, Difficulty Swallowing, Jaundice and Loss of appetitie.  Male Genitourinary: Not Present- Urinary frequency, Blood in Urine, Weak urinary stream, Discharge, Flank Pain, Incontinence, Painful  Urination, Urgency, Urinary Retention and Urinating at Night.  Musculoskeletal: Not Present- Muscle Weakness, Muscle Pain, Joint Swelling, Joint Pain, Back Pain, Morning Stiffness and Spasms.  Neurological: Not Present- Tremor, Dizziness, Blackout spells, Paralysis, Difficulty with balance and Weakness.  Psychiatric: Not Present- Insomnia.   Vitals  Weight: 225 lb Height: 72 in  Weight was reported by patient.  Height was reported by patient.  Body Surface Area: 2.28 m Body Mass Index: 30.52 kg/m  Pulse: 72 (Regular) Resp.: 12 (Unlabored)  BP: 146/82 (Sitting, Right Arm, Standard)   Physical Exam  The physical exam findings are as follows:  General  Mental Status - Alert, cooperative and good historian. General Appearance - pleasant. Not in acute distress. Orientation - Oriented X3. Build & Nutrition - Well nourished and Well developed.  Head and Neck  Head - normocephalic, atraumatic .  Neck  Global Assessment - supple. no bruit auscultated on the right and no bruit auscultated on the left.  partial lower denture  Eye  Vision - Wears corrective lenses (readers only). Pupil - Bilateral - Regular and Round.  Motion - Bilateral - EOMI.  Chest and Lung Exam  Auscultation:  Breath sounds: - clear at anterior chest wall and - clear at posterior chest wall.  Adventitious sounds: - No Adventitious sounds.  Cardiovascular  Auscultation: Rhythm - Regular rate and rhythm. Heart Sounds - S1 WNL and S2 WNL.  Murmurs & Other Heart Sounds: Auscultation of the heart reveals - No Murmurs.  Abdomen  Palpation/Percussion: Tenderness - Abdomen is non-tender to palpation. Rigidity (guarding) - Abdomen is soft.  Auscultation: Auscultation of the abdomen reveals - Bowel sounds normal.  Musculoskeletal  On exam he is a well developed male alert and oriented in no apparent distress. His hips show normal range of motion with no discomfort. Right knee exam 0 to 135, no tenderness or instability. The left  knee no effusion. Range 5 to 120. Moderate crepitus on range of motion with tenderness medial greater than lateral and no instability.   RADIOGRAPHS:  AP both knees and lateral show that there is endstage arthritis of both knees. All bone on bone.   Assessment & Plan  Primary osteoarthritis of one knee (715.16)  Impression: Left Knee   Note: Plan is for aby Dr. Lequita Halt.   Plan is to go home.   PCP - Dr. Lenox Ahr  Cards - Dr. Bryan Lemma - Patient has been seen preoperatively and felt to be stable for surgery.  He was instructed to stop the Xarelto 3 days prior to surgery and stop the Aspirin 5 days prior to surgery.  Both will be started back postop at the request of his cardiologist.  He will remain on his metoprolol.  The patient will not receive TXA (tranexamic acid) due to: Known Coronary Heart Disease.   Signed electronically by Lauraine Rinne, III PA-C

## 2012-12-12 NOTE — Anesthesia Preprocedure Evaluation (Addendum)
Anesthesia Evaluation  Patient identified by MRN, date of birth, ID band Patient awake    Reviewed: Allergy & Precautions, H&P , NPO status , Patient's Chart, lab work & pertinent test results  History of Anesthesia Complications (+) history of anesthetic complications  Airway Mallampati: II TM Distance: >3 FB Neck ROM: Full    Dental no notable dental hx.    Pulmonary neg pulmonary ROS,  breath sounds clear to auscultation  Pulmonary exam normal       Cardiovascular Exercise Tolerance: Good hypertension, Pt. on medications and Pt. on home beta blockers + CAD and + Past MI negative cardio ROS  + dysrhythmias Atrial Fibrillation Rhythm:Regular Rate:Normal  Low risk per preop cardiology exam by Dr. Herbie Baltimore on 10-21-12  ECHO: EF > 55%   Neuro/Psych negative neurological ROS  negative psych ROS   GI/Hepatic negative GI ROS, Neg liver ROS,   Endo/Other  negative endocrine ROS  Renal/GU negative Renal ROS  negative genitourinary   Musculoskeletal negative musculoskeletal ROS (+)   Abdominal (+) + obese,   Peds negative pediatric ROS (+)  Hematology negative hematology ROS (+)   Anesthesia Other Findings   Reproductive/Obstetrics negative OB ROS                         Anesthesia Physical Anesthesia Plan  ASA: III  Anesthesia Plan: Spinal   Post-op Pain Management:    Induction: Intravenous  Airway Management Planned:   Additional Equipment:   Intra-op Plan:   Post-operative Plan:   Informed Consent: I have reviewed the patients History and Physical, chart, labs and discussed the procedure including the risks, benefits and alternatives for the proposed anesthesia with the patient or authorized representative who has indicated his/her understanding and acceptance.   Dental advisory given  Plan Discussed with: CRNA  Anesthesia Plan Comments: (Discussed risks/benefits of spinal  including headache, backache, failure, bleeding, infection, and nerve damage. Patient consents to spinal. Questions answered. Coagulation studies and platelet count acceptable. He has been off Xarelto since 12-09-12. Off ASA for six days.)       Anesthesia Quick Evaluation

## 2012-12-13 ENCOUNTER — Encounter (HOSPITAL_COMMUNITY)
Admission: RE | Disposition: A | Discharge status: Skilled Nursing Facility | Payer: Self-pay | Source: Ambulatory Visit | Attending: Orthopedic Surgery

## 2012-12-13 ENCOUNTER — Encounter (HOSPITAL_COMMUNITY): Payer: Self-pay | Admitting: *Deleted

## 2012-12-13 ENCOUNTER — Ambulatory Visit (HOSPITAL_COMMUNITY): Payer: BC Managed Care – PPO | Admitting: Anesthesiology

## 2012-12-13 ENCOUNTER — Encounter (HOSPITAL_COMMUNITY): Payer: BC Managed Care – PPO | Admitting: Anesthesiology

## 2012-12-13 ENCOUNTER — Inpatient Hospital Stay (HOSPITAL_COMMUNITY)
Admission: RE | Admit: 2012-12-13 | Discharge: 2012-12-16 | DRG: 470 | Disposition: A | Discharge status: Skilled Nursing Facility | Payer: BC Managed Care – PPO | Source: Ambulatory Visit | Attending: Orthopedic Surgery | Admitting: Orthopedic Surgery

## 2012-12-13 DIAGNOSIS — I4891 Unspecified atrial fibrillation: Secondary | ICD-10-CM | POA: Diagnosis present

## 2012-12-13 DIAGNOSIS — I2510 Atherosclerotic heart disease of native coronary artery without angina pectoris: Secondary | ICD-10-CM | POA: Diagnosis present

## 2012-12-13 DIAGNOSIS — Z96652 Presence of left artificial knee joint: Secondary | ICD-10-CM

## 2012-12-13 DIAGNOSIS — E1169 Type 2 diabetes mellitus with other specified complication: Secondary | ICD-10-CM | POA: Diagnosis present

## 2012-12-13 DIAGNOSIS — E785 Hyperlipidemia, unspecified: Secondary | ICD-10-CM | POA: Diagnosis present

## 2012-12-13 DIAGNOSIS — E669 Obesity, unspecified: Secondary | ICD-10-CM | POA: Diagnosis present

## 2012-12-13 DIAGNOSIS — E871 Hypo-osmolality and hyponatremia: Secondary | ICD-10-CM | POA: Diagnosis not present

## 2012-12-13 DIAGNOSIS — Z6830 Body mass index (BMI) 30.0-30.9, adult: Secondary | ICD-10-CM

## 2012-12-13 DIAGNOSIS — I252 Old myocardial infarction: Secondary | ICD-10-CM

## 2012-12-13 DIAGNOSIS — I480 Paroxysmal atrial fibrillation: Secondary | ICD-10-CM

## 2012-12-13 DIAGNOSIS — M1710 Unilateral primary osteoarthritis, unspecified knee: Principal | ICD-10-CM | POA: Diagnosis present

## 2012-12-13 DIAGNOSIS — Z7982 Long term (current) use of aspirin: Secondary | ICD-10-CM

## 2012-12-13 DIAGNOSIS — I4821 Permanent atrial fibrillation: Secondary | ICD-10-CM | POA: Diagnosis present

## 2012-12-13 DIAGNOSIS — M179 Osteoarthritis of knee, unspecified: Secondary | ICD-10-CM | POA: Diagnosis present

## 2012-12-13 DIAGNOSIS — Z79899 Other long term (current) drug therapy: Secondary | ICD-10-CM

## 2012-12-13 DIAGNOSIS — I10 Essential (primary) hypertension: Secondary | ICD-10-CM | POA: Diagnosis present

## 2012-12-13 HISTORY — PX: TOTAL KNEE ARTHROPLASTY: SHX125

## 2012-12-13 LAB — TYPE AND SCREEN: ABO/RH(D): O POS

## 2012-12-13 LAB — GLUCOSE, CAPILLARY
Glucose-Capillary: 167 mg/dL — ABNORMAL HIGH (ref 70–99)
Glucose-Capillary: 184 mg/dL — ABNORMAL HIGH (ref 70–99)

## 2012-12-13 SURGERY — ARTHROPLASTY, KNEE, TOTAL
Anesthesia: Spinal | Site: Knee | Laterality: Left | Wound class: Clean

## 2012-12-13 MED ORDER — PROMETHAZINE HCL 25 MG/ML IJ SOLN: 6.2500 mg | INTRAMUSCULAR | Status: DC | PRN

## 2012-12-13 MED ORDER — MENTHOL 3 MG MT LOZG
1.0000 | LOZENGE | OROMUCOSAL | Status: DC | PRN
  Filled 2012-12-13: qty 9

## 2012-12-13 MED ORDER — PROPOFOL INFUSION 10 MG/ML OPTIME
INTRAVENOUS | Status: DC | PRN
  Administered 2012-12-13: 140 ug/kg/min via INTRAVENOUS

## 2012-12-13 MED ORDER — HYDROMORPHONE HCL PF 1 MG/ML IJ SOLN
0.2500 mg | INTRAMUSCULAR | Status: DC | PRN
Start: 2012-12-13 — End: 2012-12-13

## 2012-12-13 MED ORDER — LOSARTAN POTASSIUM 50 MG PO TABS
50.0000 mg | ORAL_TABLET | Freq: Every day | ORAL | Status: DC
  Administered 2012-12-13 – 2012-12-15 (×3): 50 mg via ORAL
  Filled 2012-12-13 (×4): qty 1

## 2012-12-13 MED ORDER — SODIUM CHLORIDE 0.9 % IV SOLN: INTRAVENOUS | Status: DC

## 2012-12-13 MED ORDER — METHOCARBAMOL 500 MG PO TABS
500.0000 mg | ORAL_TABLET | Freq: Four times a day (QID) | ORAL | Status: DC | PRN
  Administered 2012-12-13 – 2012-12-16 (×7): 500 mg via ORAL
  Filled 2012-12-13 (×7): qty 1

## 2012-12-13 MED ORDER — ONDANSETRON HCL 4 MG/2ML IJ SOLN
4.0000 mg | Freq: Four times a day (QID) | INTRAMUSCULAR | Status: DC | PRN
  Administered 2012-12-15: 09:00:00 4 mg via INTRAVENOUS
  Filled 2012-12-13 (×2): qty 2

## 2012-12-13 MED ORDER — MORPHINE SULFATE 2 MG/ML IJ SOLN
1.0000 mg | INTRAMUSCULAR | Status: DC | PRN
Start: 2012-12-13 — End: 2012-12-16

## 2012-12-13 MED ORDER — PROPOFOL 10 MG/ML IV BOLUS
INTRAVENOUS | Status: DC | PRN
  Administered 2012-12-13: 30 mg via INTRAVENOUS

## 2012-12-13 MED ORDER — ACETAMINOPHEN 500 MG PO TABS
1000.0000 mg | ORAL_TABLET | Freq: Once | ORAL | Status: AC
  Administered 2012-12-13: 1000 mg via ORAL
  Filled 2012-12-13: qty 2

## 2012-12-13 MED ORDER — OXYCODONE HCL 5 MG PO TABS
5.0000 mg | ORAL_TABLET | ORAL | Status: DC | PRN
  Administered 2012-12-13 (×2): 10 mg via ORAL
  Administered 2012-12-13 (×2): 5 mg via ORAL
  Administered 2012-12-14 – 2012-12-15 (×9): 10 mg via ORAL
  Filled 2012-12-13 (×11): qty 2
  Filled 2012-12-13 (×2): qty 1

## 2012-12-13 MED ORDER — ASPIRIN EC 81 MG PO TBEC
81.0000 mg | DELAYED_RELEASE_TABLET | Freq: Every day | ORAL | Status: DC
  Administered 2012-12-14 – 2012-12-16 (×3): 81 mg via ORAL
  Filled 2012-12-13 (×3): qty 1

## 2012-12-13 MED ORDER — DEXAMETHASONE SODIUM PHOSPHATE 10 MG/ML IJ SOLN
10.0000 mg | Freq: Once | INTRAMUSCULAR | Status: AC
  Administered 2012-12-13: 10 mg via INTRAVENOUS

## 2012-12-13 MED ORDER — FENTANYL CITRATE 0.05 MG/ML IJ SOLN
INTRAMUSCULAR | Status: AC
  Filled 2012-12-13: qty 2

## 2012-12-13 MED ORDER — ACETAMINOPHEN 650 MG RE SUPP: 650.0000 mg | Freq: Four times a day (QID) | RECTAL | Status: DC | PRN

## 2012-12-13 MED ORDER — SODIUM CHLORIDE 0.9 % IJ SOLN
INTRAMUSCULAR | Status: DC | PRN
  Administered 2012-12-13: 30 mL via INTRAVENOUS

## 2012-12-13 MED ORDER — BISACODYL 10 MG RE SUPP: 10.0000 mg | Freq: Every day | RECTAL | Status: DC | PRN

## 2012-12-13 MED ORDER — FENTANYL CITRATE 0.05 MG/ML IJ SOLN
25.0000 ug | INTRAMUSCULAR | Status: DC | PRN
  Administered 2012-12-13: 50 ug via INTRAVENOUS

## 2012-12-13 MED ORDER — NITROGLYCERIN 0.4 MG SL SUBL: 0.4000 mg | SUBLINGUAL_TABLET | SUBLINGUAL | Status: DC | PRN

## 2012-12-13 MED ORDER — DILTIAZEM HCL ER 240 MG PO CP24
240.0000 mg | ORAL_CAPSULE | Freq: Every morning | ORAL | Status: DC
  Administered 2012-12-14 – 2012-12-16 (×3): 240 mg via ORAL
  Filled 2012-12-13 (×3): qty 1

## 2012-12-13 MED ORDER — METHOCARBAMOL 100 MG/ML IJ SOLN
500.0000 mg | Freq: Four times a day (QID) | INTRAVENOUS | Status: DC | PRN
  Administered 2012-12-13: 500 mg via INTRAVENOUS
  Filled 2012-12-13: qty 5

## 2012-12-13 MED ORDER — CEFAZOLIN SODIUM-DEXTROSE 2-3 GM-% IV SOLR
2.0000 g | INTRAVENOUS | Status: AC
  Administered 2012-12-13: 2 g via INTRAVENOUS

## 2012-12-13 MED ORDER — 0.9 % SODIUM CHLORIDE (POUR BTL) OPTIME
TOPICAL | Status: DC | PRN
  Administered 2012-12-13: 1000 mL

## 2012-12-13 MED ORDER — SODIUM CHLORIDE 0.9 % IJ SOLN
INTRAMUSCULAR | Status: AC
  Filled 2012-12-13: qty 50

## 2012-12-13 MED ORDER — MIDAZOLAM HCL 5 MG/5ML IJ SOLN
INTRAMUSCULAR | Status: DC | PRN
  Administered 2012-12-13: 2 mg via INTRAVENOUS

## 2012-12-13 MED ORDER — ATORVASTATIN CALCIUM 40 MG PO TABS
40.0000 mg | ORAL_TABLET | Freq: Every evening | ORAL | Status: DC
  Administered 2012-12-13 – 2012-12-15 (×3): 40 mg via ORAL
  Filled 2012-12-13 (×4): qty 1

## 2012-12-13 MED ORDER — TRAMADOL HCL 50 MG PO TABS: 50.0000 mg | ORAL_TABLET | Freq: Four times a day (QID) | ORAL | Status: DC | PRN

## 2012-12-13 MED ORDER — BUPIVACAINE HCL 0.25 % IJ SOLN
INTRAMUSCULAR | Status: DC | PRN
  Administered 2012-12-13: 20 mL

## 2012-12-13 MED ORDER — PHENOL 1.4 % MT LIQD: 1.0000 | OROMUCOSAL | Status: DC | PRN

## 2012-12-13 MED ORDER — ACETAMINOPHEN 500 MG PO TABS
1000.0000 mg | ORAL_TABLET | Freq: Four times a day (QID) | ORAL | Status: AC
  Administered 2012-12-13 – 2012-12-14 (×4): 1000 mg via ORAL
  Filled 2012-12-13 (×4): qty 2

## 2012-12-13 MED ORDER — DIPHENHYDRAMINE HCL 12.5 MG/5ML PO ELIX: 12.5000 mg | ORAL_SOLUTION | ORAL | Status: DC | PRN

## 2012-12-13 MED ORDER — CEFAZOLIN SODIUM-DEXTROSE 2-3 GM-% IV SOLR
2.0000 g | Freq: Four times a day (QID) | INTRAVENOUS | Status: AC
  Administered 2012-12-13 (×2): 2 g via INTRAVENOUS
  Filled 2012-12-13 (×2): qty 50

## 2012-12-13 MED ORDER — DEXAMETHASONE SODIUM PHOSPHATE 10 MG/ML IJ SOLN
10.0000 mg | Freq: Every day | INTRAMUSCULAR | Status: AC
  Filled 2012-12-13: qty 1

## 2012-12-13 MED ORDER — METOPROLOL TARTRATE 25 MG PO TABS
25.0000 mg | ORAL_TABLET | Freq: Two times a day (BID) | ORAL | Status: DC
  Administered 2012-12-13 – 2012-12-16 (×6): 25 mg via ORAL
  Filled 2012-12-13 (×7): qty 1

## 2012-12-13 MED ORDER — FENTANYL CITRATE 0.05 MG/ML IJ SOLN
INTRAMUSCULAR | Status: DC | PRN
  Administered 2012-12-13: 100 ug via INTRAVENOUS

## 2012-12-13 MED ORDER — PHENYLEPHRINE HCL 10 MG/ML IJ SOLN
INTRAMUSCULAR | Status: DC | PRN
  Administered 2012-12-13 (×4): 80 ug via INTRAVENOUS

## 2012-12-13 MED ORDER — ACETAMINOPHEN 325 MG PO TABS: 650.0000 mg | ORAL_TABLET | Freq: Four times a day (QID) | ORAL | Status: DC | PRN

## 2012-12-13 MED ORDER — SODIUM CHLORIDE 0.9 % IV SOLN
10.0000 mg | INTRAVENOUS | Status: DC | PRN
  Administered 2012-12-13: 50 ug/min via INTRAVENOUS

## 2012-12-13 MED ORDER — METOCLOPRAMIDE HCL 10 MG PO TABS: 5.0000 mg | ORAL_TABLET | Freq: Three times a day (TID) | ORAL | Status: DC | PRN

## 2012-12-13 MED ORDER — CEFAZOLIN SODIUM-DEXTROSE 2-3 GM-% IV SOLR
INTRAVENOUS | Status: AC
  Filled 2012-12-13: qty 50

## 2012-12-13 MED ORDER — METOCLOPRAMIDE HCL 5 MG/ML IJ SOLN: 5.0000 mg | Freq: Three times a day (TID) | INTRAMUSCULAR | Status: DC | PRN

## 2012-12-13 MED ORDER — SODIUM CHLORIDE 0.9 % IR SOLN
Status: DC | PRN
  Administered 2012-12-13: 1000 mL

## 2012-12-13 MED ORDER — KETOROLAC TROMETHAMINE 15 MG/ML IJ SOLN: 7.5000 mg | Freq: Four times a day (QID) | INTRAMUSCULAR | Status: AC | PRN

## 2012-12-13 MED ORDER — POTASSIUM CHLORIDE IN NACL 20-0.9 MEQ/L-% IV SOLN
INTRAVENOUS | Status: DC
  Administered 2012-12-13 – 2012-12-14 (×2): via INTRAVENOUS
  Filled 2012-12-13 (×3): qty 1000

## 2012-12-13 MED ORDER — BUPIVACAINE LIPOSOME 1.3 % IJ SUSP
INTRAMUSCULAR | Status: DC | PRN
  Administered 2012-12-13: 20 mL

## 2012-12-13 MED ORDER — ONDANSETRON HCL 4 MG/2ML IJ SOLN
INTRAMUSCULAR | Status: DC | PRN
  Administered 2012-12-13: 4 mg via INTRAVENOUS

## 2012-12-13 MED ORDER — DEXAMETHASONE 6 MG PO TABS
10.0000 mg | ORAL_TABLET | Freq: Every day | ORAL | Status: AC
  Administered 2012-12-14: 10:00:00 10 mg via ORAL
  Filled 2012-12-13: qty 1

## 2012-12-13 MED ORDER — BUPIVACAINE HCL (PF) 0.25 % IJ SOLN
INTRAMUSCULAR | Status: AC
  Filled 2012-12-13: qty 30

## 2012-12-13 MED ORDER — BUPIVACAINE LIPOSOME 1.3 % IJ SUSP
20.0000 mL | Freq: Once | INTRAMUSCULAR | Status: DC
  Filled 2012-12-13: qty 20

## 2012-12-13 MED ORDER — LACTATED RINGERS IV SOLN
INTRAVENOUS | Status: DC
  Administered 2012-12-13: 09:00:00 via INTRAVENOUS
  Administered 2012-12-13: 1000 mL via INTRAVENOUS

## 2012-12-13 MED ORDER — FLEET ENEMA 7-19 GM/118ML RE ENEM: 1.0000 | ENEMA | Freq: Once | RECTAL | Status: AC | PRN

## 2012-12-13 MED ORDER — ONDANSETRON HCL 4 MG PO TABS: 4.0000 mg | ORAL_TABLET | Freq: Four times a day (QID) | ORAL | Status: DC | PRN

## 2012-12-13 MED ORDER — BUPIVACAINE IN DEXTROSE 0.75-8.25 % IT SOLN
INTRATHECAL | Status: DC | PRN
  Administered 2012-12-13: 2 mL via INTRATHECAL

## 2012-12-13 MED ORDER — POLYETHYLENE GLYCOL 3350 17 G PO PACK: 17.0000 g | PACK | Freq: Every day | ORAL | Status: DC | PRN

## 2012-12-13 MED ORDER — RIVAROXABAN 10 MG PO TABS
10.0000 mg | ORAL_TABLET | Freq: Every day | ORAL | Status: DC
  Administered 2012-12-14: 10 mg via ORAL
  Filled 2012-12-13 (×2): qty 1

## 2012-12-13 MED ORDER — DOCUSATE SODIUM 100 MG PO CAPS
100.0000 mg | ORAL_CAPSULE | Freq: Two times a day (BID) | ORAL | Status: DC
Start: 2012-12-13 — End: 2012-12-16
  Administered 2012-12-13 – 2012-12-16 (×6): 100 mg via ORAL

## 2012-12-13 SURGICAL SUPPLY — 56 items
BAG ZIPLOCK 12X15 (MISCELLANEOUS) ×2 IMPLANT
BANDAGE ELASTIC 6 VELCRO ST LF (GAUZE/BANDAGES/DRESSINGS) ×2 IMPLANT
BANDAGE ESMARK 6X9 LF (GAUZE/BANDAGES/DRESSINGS) ×1 IMPLANT
BLADE SAG 18X100X1.27 (BLADE) ×2 IMPLANT
BLADE SAW SGTL 11.0X1.19X90.0M (BLADE) ×2 IMPLANT
BNDG ESMARK 6X9 LF (GAUZE/BANDAGES/DRESSINGS) ×2
BOWL SMART MIX CTS (DISPOSABLE) ×2 IMPLANT
CAPT RP KNEE ×2 IMPLANT
CEMENT HV SMART SET (Cement) ×4 IMPLANT
CLOTH BEACON ORANGE TIMEOUT ST (SAFETY) ×2 IMPLANT
CUFF TOURN SGL QUICK 34 (TOURNIQUET CUFF) ×1
CUFF TRNQT CYL 34X4X40X1 (TOURNIQUET CUFF) ×1 IMPLANT
DECANTER SPIKE VIAL GLASS SM (MISCELLANEOUS) ×2 IMPLANT
DRAPE EXTREMITY T 121X128X90 (DRAPE) ×2 IMPLANT
DRAPE POUCH INSTRU U-SHP 10X18 (DRAPES) ×2 IMPLANT
DRAPE U-SHAPE 47X51 STRL (DRAPES) ×2 IMPLANT
DRSG ADAPTIC 3X8 NADH LF (GAUZE/BANDAGES/DRESSINGS) ×2 IMPLANT
DRSG PAD ABDOMINAL 8X10 ST (GAUZE/BANDAGES/DRESSINGS) ×2 IMPLANT
DURAPREP 26ML APPLICATOR (WOUND CARE) ×2 IMPLANT
ELECT REM PT RETURN 9FT ADLT (ELECTROSURGICAL) ×2
ELECTRODE REM PT RTRN 9FT ADLT (ELECTROSURGICAL) ×1 IMPLANT
EVACUATOR 1/8 PVC DRAIN (DRAIN) ×2 IMPLANT
FACESHIELD LNG OPTICON STERILE (SAFETY) ×10 IMPLANT
GAUZE SPONGE 4X4 16PLY XRAY LF (GAUZE/BANDAGES/DRESSINGS) ×2 IMPLANT
GLOVE BIO SURGEON STRL SZ7.5 (GLOVE) IMPLANT
GLOVE BIO SURGEON STRL SZ8 (GLOVE) ×2 IMPLANT
GLOVE BIOGEL PI IND STRL 8 (GLOVE) ×2 IMPLANT
GLOVE BIOGEL PI INDICATOR 8 (GLOVE) ×2
GLOVE SURG SS PI 6.5 STRL IVOR (GLOVE) IMPLANT
GOWN PREVENTION PLUS LG XLONG (DISPOSABLE) ×2 IMPLANT
GOWN STRL REIN XL XLG (GOWN DISPOSABLE) IMPLANT
HANDPIECE INTERPULSE COAX TIP (DISPOSABLE) ×1
IMMOBILIZER KNEE 20 (SOFTGOODS) ×2
IMMOBILIZER KNEE 20 THIGH 36 (SOFTGOODS) ×1 IMPLANT
KIT BASIN OR (CUSTOM PROCEDURE TRAY) ×2 IMPLANT
MANIFOLD NEPTUNE II (INSTRUMENTS) ×2 IMPLANT
NDL SAFETY ECLIPSE 18X1.5 (NEEDLE) ×2 IMPLANT
NEEDLE HYPO 18GX1.5 SHARP (NEEDLE) ×2
NS IRRIG 1000ML POUR BTL (IV SOLUTION) ×2 IMPLANT
PACK TOTAL JOINT (CUSTOM PROCEDURE TRAY) ×2 IMPLANT
PADDING CAST COTTON 6X4 STRL (CAST SUPPLIES) ×2 IMPLANT
POSITIONER SURGICAL ARM (MISCELLANEOUS) ×2 IMPLANT
SET HNDPC FAN SPRY TIP SCT (DISPOSABLE) ×1 IMPLANT
SPONGE GAUZE 4X4 12PLY (GAUZE/BANDAGES/DRESSINGS) ×2 IMPLANT
STRIP CLOSURE SKIN 1/2X4 (GAUZE/BANDAGES/DRESSINGS) ×2 IMPLANT
SUCTION FRAZIER 12FR DISP (SUCTIONS) ×2 IMPLANT
SUT MNCRL AB 4-0 PS2 18 (SUTURE) ×2 IMPLANT
SUT VIC AB 2-0 CT1 27 (SUTURE) ×3
SUT VIC AB 2-0 CT1 TAPERPNT 27 (SUTURE) ×3 IMPLANT
SUT VLOC 180 0 24IN GS25 (SUTURE) ×2 IMPLANT
SYR 20CC LL (SYRINGE) ×2 IMPLANT
SYR 50ML LL SCALE MARK (SYRINGE) ×2 IMPLANT
TOWEL OR 17X26 10 PK STRL BLUE (TOWEL DISPOSABLE) ×4 IMPLANT
TRAY FOLEY CATH 14FRSI W/METER (CATHETERS) ×2 IMPLANT
WATER STERILE IRR 1500ML POUR (IV SOLUTION) ×2 IMPLANT
WRAP KNEE MAXI GEL POST OP (GAUZE/BANDAGES/DRESSINGS) ×2 IMPLANT

## 2012-12-13 NOTE — Care Management Note (Addendum)
    Page 1 of 1   12/14/2012     6:36:08 PM   CARE MANAGEMENT NOTE 12/14/2012  Patient:  Chris Wilcox, Chris Wilcox   Account Number:  [id number]  Date Initiated:  12/13/2012  Documentation initiated by:  Colleen Can  Subjective/Objective Assessment:   dx total left knee replacemnt     Action/Plan:   PT/OT pending. CM will folllow   Anticipated DC Date:  12/16/2012   Anticipated DC Plan:  HOME W HOME HEALTH SERVICES      DC Planning Services  CM consult      Choice offered to / List presented to:             Status of service:  Completed, signed off Medicare Important Message given?   (If response is "NO", the following Medicare IM given date fields will be blank) Date Medicare IM given:   Date Additional Medicare IM given:    Discharge Disposition:    Per UR Regulation:  Reviewed for med. necessity/level of care/duration of stay  If discussed at Long Length of Stay Meetings, dates discussed:    Comments:  12/14/2012 Colleen Can BSN RN CCM [phone] CM spoke with patient andspouse. Current plans are for pt to go to SNF rehab. CSW referral. CM will follow as needed.

## 2012-12-13 NOTE — Op Note (Signed)
Pre-operative diagnosis- Osteoarthritis  Left knee(s)  Post-operative diagnosis- Osteoarthritis Left knee(s)  Procedure-  Left  Total Knee Arthroplasty  Surgeon- Chris Rankin. Jaymz Traywick, MD  Assistant- Avel Peace, PA-C   Anesthesia-  Spinal EBL-* No blood loss amount entered *  Drains Hemovac  Tourniquet time-  Total Tourniquet Time Documented: Thigh (Left) - 34 minutes Total: Thigh (Left) - 34 minutes    Complications- None  Condition-PACU - hemodynamically stable.   Brief Clinical Note   Chris Wilcox is a 57 y.o. year old male with end stage OA of his left knee with progressively worsening pain and dysfunction. He has constant pain, with activity and at rest and significant functional deficits with difficulties even with ADLs. He has had extensive non-op management including analgesics, and home exercise program, but remains in significant pain with significant dysfunction. Radiographs show severe bone on bone arthritis medial and patellofemoral. He presents now for left Total Knee Arthroplasty.     Procedure in detail---   The patient is brought into the operating room and positioned supine on the operating table. After successful administration of  Spinal,   a tourniquet is placed high on the  Left thigh(s) and the lower extremity is prepped and draped in the usual sterile fashion. Time out is performed by the operating team and then the  Left lower extremity is wrapped in Esmarch, knee flexed and the tourniquet inflated to 300 mmHg.       A midline incision is made with a ten blade through the subcutaneous tissue to the level of the extensor mechanism. A fresh blade is used to make a medial parapatellar arthrotomy. Soft tissue over the proximal medial tibia is subperiosteally elevated to the joint line with a knife and into the semimembranosus bursa with a Cobb elevator. Soft tissue over the proximal lateral tibia is elevated with attention being paid to avoiding the patellar tendon on the  tibial tubercle. The patella is everted, knee flexed 90 degrees and the ACL and PCL are removed. Findings are bone on bone medial and patellofemoral with massive global osteophytes and large medial defect.        The drill is used to create a starting hole in the distal femur and the canal is thoroughly irrigated with sterile saline to remove the fatty contents. The 5 degree Left  valgus alignment guide is placed into the femoral canal and the distal femoral cutting block is pinned to remove 10 mm off the distal femur. Resection is made with an oscillating saw.      The tibia is subluxed forward and the menisci are removed. The extramedullary alignment guide is placed referencing proximally at the medial aspect of the tibial tubercle and distally along the second metatarsal axis and tibial crest. The block is pinned to remove 2mm off the more deficient medial  side. Resection is made with an oscillating saw. Size 4is the most appropriate size for the tibia and the proximal tibia is prepared with the modular drill and keel punch for that size.      The femoral sizing guide is placed and size 5 is most appropriate. Rotation is marked off the epicondylar axis and confirmed by creating a rectangular flexion gap at 90 degrees. The size 5 cutting block is pinned in this rotation and the anterior, posterior and chamfer cuts are made with the oscillating saw. The intercondylar block is then placed and that cut is made.      Trial size 4 tibial component, trial size  5 posterior stabilized femur and a 15  mm posterior stabilized rotating platform insert trial is placed. Full extension is achieved with excellent varus/valgus and anterior/posterior balance throughout full range of motion. The patella is everted and thickness measured to be 27  mm. Free hand resection is taken to 15 mm, a 41 template is placed, lug holes are drilled, trial patella is placed, and it tracks normally. Osteophytes are removed off the posterior  femur with the trial in place. All trials are removed and the cut bone surfaces prepared with pulsatile lavage. Cement is mixed and once ready for implantation, the size 4 tibial implant, size  5 posterior stabilized femoral component, and the size 41 patella are cemented in place and the patella is held with the clamp. The trial insert is placed and the knee held in full extension. The Exparel (20 ml mixed with 30 ml saline) and .25% Bupivicaine, are injected into the extensor mechanism, posterior capsule, medial and lateral gutters and subcutaneous tissues.  All extruded cement is removed and once the cement is hard the permanent 15 mm posterior stabilized rotating platform insert is placed into the tibial tray.      The wound is copiously irrigated with saline solution and the extensor mechanism closed over a hemovac drain with #1 PDS suture. The tourniquet is released for a total tourniquet time of 34  minutes. Flexion against gravity is 140 degrees and the patella tracks normally. Subcutaneous tissue is closed with 2.0 vicryl and subcuticular with running 4.0 Monocryl. The incision is cleaned and dried and steri-strips and a bulky sterile dressing are applied. The limb is placed into a knee immobilizer and the patient is awakened and transported to recovery in stable condition.      Please note that a surgical assistant was a medical necessity for this procedure in order to perform it in a safe and expeditious manner. Surgical assistant was necessary to retract the ligaments and vital neurovascular structures to prevent injury to them and also necessary for proper positioning of the limb to allow for anatomic placement of the prosthesis.   Chris Rankin Shandon Burlingame, MD    12/13/2012, 9:16 AM

## 2012-12-13 NOTE — Preoperative (Signed)
Beta Blockers   Reason not to administer Beta Blockers:Not Applicable 

## 2012-12-13 NOTE — Plan of Care (Signed)
Problem: Consults Goal: Diagnosis- Total Joint Replacement Primary Total Knee     

## 2012-12-13 NOTE — Anesthesia Procedure Notes (Signed)
Spinal  Patient location during procedure: OR Staffing Anesthesiologist: Azell Der Performed by: anesthesiologist  Preanesthetic Checklist Completed: patient identified, site marked, surgical consent, pre-op evaluation, timeout performed, IV checked, risks and benefits discussed and monitors and equipment checked Spinal Block Patient position: sitting Prep: Betadine Patient monitoring: heart rate, continuous pulse ox and blood pressure Location: L3-4 Injection technique: single-shot Needle Needle type: Sprotte  Needle gauge: 24 G Needle length: 9 cm Additional Notes Expiration date of kit checked and confirmed. Patient tolerated procedure well, without complications. CSF clear. No paresthesia.

## 2012-12-13 NOTE — H&P (View-Only) (Signed)
Chris Wilcox   DOB: 04/04/1955  Married / Language: English / Race: White  Male  Date of Admission: 12/13/2012   Chief Complaint: Left Knee Pain   History of Present Illness  The patient is a 57 year old male who comes in for a preoperative History and Physical. The patient is scheduled for a left total knee arthroplasty to be performed by Dr. Frank V. Aluisio, MD at [facility] on 12/13/2012.  The patient is a 57 year old male who presents today for follow up of their knee. The patient is being followed for their left knee pain and osteoarthritis. They are now out from the last visit. Symptoms reported today include: pain. He wants to proceed with surgery.  The knee is bothering him at all times. It is limiting what he can and can not do. The knee wants to give out on him at times. He will get sharp pains in the knee. He has some right knee pain but no where near as bad as the left. He is not interested in injections as he wants a more permanent fix of this and also because his function is the thing that is really worse than anything right now and the injection will not help that. He is ready to proceed with the knee replacement.  They have been treated conservatively in the past for the above stated problem and despite conservative measures, they continue to have progressive pain and severe functional limitations and dysfunction. They have failed non-operative management including home exercise, medications, and injections. It is felt that they would benefit from undergoing total joint replacement. Risks and benefits of the procedure have been discussed with the patient and they elect to proceed with surgery. There are no active contraindications to surgery such as ongoing infection or rapidly progressive neurological disease.   Problem List  Primary osteoarthritis of one knee (715.16)   Allergies  Codeine Derivatives. Nausea.   Medications:  Aspirin 81 mg  Atorvastatin 40  mg Diltiazem XR 240 mg Glucosamine 500 mg Losartan 50 mg Metoprolol 50 mg Naproxen 220 mg Nitroglycerin 0.4 mg SL Omega 3 Fish Oil 1000 mg Xarelto 20 mg  Family History  Depression. First Degree Relatives. father  Rheumatoid Arthritis. sister  Diabetes Mellitus. father and grandfather fathers side  Cancer. 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 greater than 5  Marital status. married  Illicit drug use. no  Living situation. live with spouse  Pain Contract. no  Tobacco use. Never smoker. never smoker  Previously in rehab. no  Exercise. Exercises daily; does team sport  Current work status. working full time  Drug/Alcohol Rehab (Currently). no  Children. 2  Alcohol use. never consumed alcohol  Past Surgical History  Arthroscopy of Knee. left   Medical History  Coronary artery disease  Myocardial infarction. 2008  Hypertension  Atrial Fibrillation. Chronic  Hyperlipidemia. Dyslipidemia  Cardiac Arrhythmia   Review of Systems  General: Not Present- Chills, Fever, Night Sweats, Fatigue, Weight Gain, Weight Loss and Memory Loss.  Skin: Not Present- Hives, Itching, Rash, Eczema and Lesions.  HEENT: Not Present- Tinnitus, Headache, Double Vision, Visual Loss, Hearing Loss and Dentures.  Respiratory: Not Present- Shortness of breath with exertion, Shortness of breath at rest, Allergies, Coughing up blood and Chronic Cough.  Cardiovascular: Not Present- Chest Pain, Racing/skipping heartbeats, Difficulty Breathing Lying Down, Murmur, Swelling and Palpitations.  Gastrointestinal: Not   Present- Bloody Stool, Heartburn, Abdominal Pain, Vomiting, Nausea, Constipation, Diarrhea, Difficulty Swallowing, Jaundice and Loss of appetitie.  Male Genitourinary: Not Present- Urinary frequency, Blood in Urine, Weak urinary stream, Discharge, Flank Pain, Incontinence, Painful  Urination, Urgency, Urinary Retention and Urinating at Night.  Musculoskeletal: Not Present- Muscle Weakness, Muscle Pain, Joint Swelling, Joint Pain, Back Pain, Morning Stiffness and Spasms.  Neurological: Not Present- Tremor, Dizziness, Blackout spells, Paralysis, Difficulty with balance and Weakness.  Psychiatric: Not Present- Insomnia.   Vitals  Weight: 225 lb Height: 72 in  Weight was reported by patient.  Height was reported by patient.  Body Surface Area: 2.28 m Body Mass Index: 30.52 kg/m  Pulse: 72 (Regular) Resp.: 12 (Unlabored)  BP: 146/82 (Sitting, Right Arm, Standard)   Physical Exam  The physical exam findings are as follows:  General  Mental Status - Alert, cooperative and good historian. General Appearance - pleasant. Not in acute distress. Orientation - Oriented X3. Build & Nutrition - Well nourished and Well developed.  Head and Neck  Head - normocephalic, atraumatic .  Neck  Global Assessment - supple. no bruit auscultated on the right and no bruit auscultated on the left.  partial lower denture  Eye  Vision - Wears corrective lenses (readers only). Pupil - Bilateral - Regular and Round.  Motion - Bilateral - EOMI.  Chest and Lung Exam  Auscultation:  Breath sounds: - clear at anterior chest wall and - clear at posterior chest wall.  Adventitious sounds: - No Adventitious sounds.  Cardiovascular  Auscultation: Rhythm - Regular rate and rhythm. Heart Sounds - S1 WNL and S2 WNL.  Murmurs & Other Heart Sounds: Auscultation of the heart reveals - No Murmurs.  Abdomen  Palpation/Percussion: Tenderness - Abdomen is non-tender to palpation. Rigidity (guarding) - Abdomen is soft.  Auscultation: Auscultation of the abdomen reveals - Bowel sounds normal.  Musculoskeletal  On exam he is a well developed male alert and oriented in no apparent distress. His hips show normal range of motion with no discomfort. Right knee exam 0 to 135, no tenderness or instability. The left  knee no effusion. Range 5 to 120. Moderate crepitus on range of motion with tenderness medial greater than lateral and no instability.   RADIOGRAPHS:  AP both knees and lateral show that there is endstage arthritis of both knees. All bone on bone.   Assessment & Plan  Primary osteoarthritis of one knee (715.16)  Impression: Left Knee   Note: Plan is for aby Dr. Aluisio.   Plan is to go home.   PCP - Dr. Bryon Hofman  Cards - Dr. David Harding - Patient has been seen preoperatively and felt to be stable for surgery.  He was instructed to stop the Xarelto 3 days prior to surgery and stop the Aspirin 5 days prior to surgery.  Both will be started back postop at the request of his cardiologist.  He will remain on his metoprolol.  The patient will not receive TXA (tranexamic acid) due to: Known Coronary Heart Disease.   Signed electronically by Tyrika Newman L La Shehan, III PA-C   

## 2012-12-13 NOTE — Anesthesia Postprocedure Evaluation (Signed)
  Anesthesia Post-op Note  Patient: Chris Wilcox  Procedure(s) Performed: Procedure(s) (LRB): LEFT TOTAL KNEE ARTHROPLASTY (Left)  Patient Location: PACU  Anesthesia Type: Spinal  Level of Consciousness: awake and alert   Airway and Oxygen Therapy: Patient Spontanous Breathing  Post-op Pain: mild  Post-op Assessment: Post-op Vital signs reviewed, Patient's Cardiovascular Status Stable, Respiratory Function Stable, Patent Airway and No signs of Nausea or vomiting  Last Vitals:  Filed Vitals:   12/13/12 1115  BP: 93/65  Pulse: 68  Temp:   Resp: 15    Post-op Vital Signs: stable   Complications: No apparent anesthesia complications. Moving both feet. VSS. Feels fine.

## 2012-12-13 NOTE — Transfer of Care (Signed)
Immediate Anesthesia Transfer of Care Note  Patient: Chris Wilcox  Procedure(s) Performed: Procedure(s): LEFT TOTAL KNEE ARTHROPLASTY (Left)  Patient Location: PACU  Anesthesia Type:Spinal  Level of Consciousness: awake, alert  and oriented  Airway & Oxygen Therapy: Patient Spontanous Breathing and Patient connected to face mask oxygen  Post-op Assessment: Report given to PACU RN and Post -op Vital signs reviewed and stable  Post vital signs: Reviewed and stable  Complications: No apparent anesthesia complications

## 2012-12-13 NOTE — Interval H&P Note (Signed)
History and Physical Interval Note:  12/13/2012 7:23 AM  Chris Wilcox  has presented today for surgery, with the diagnosis of OA LEFT KNEE  The various methods of treatment have been discussed with the patient and family. After consideration of risks, benefits and other options for treatment, the patient has consented to  Procedure(s): LEFT TOTAL KNEE ARTHROPLASTY (Left) as a surgical intervention .  The patient's history has been reviewed, patient examined, no change in status, stable for surgery.  I have reviewed the patient's chart and labs.  Questions were answered to the patient's satisfaction.     Loanne Drilling

## 2012-12-14 DIAGNOSIS — E871 Hypo-osmolality and hyponatremia: Secondary | ICD-10-CM | POA: Diagnosis not present

## 2012-12-14 LAB — GLUCOSE, CAPILLARY
Glucose-Capillary: 153 mg/dL — ABNORMAL HIGH (ref 70–99)
Glucose-Capillary: 151 mg/dL — ABNORMAL HIGH (ref 70–99)
Glucose-Capillary: 155 mg/dL — ABNORMAL HIGH (ref 70–99)
Glucose-Capillary: 206 mg/dL — ABNORMAL HIGH (ref 70–99)

## 2012-12-14 LAB — BASIC METABOLIC PANEL
BUN: 16 mg/dL (ref 6–23)
CO2: 23 mEq/L (ref 19–32)
GFR calc Af Amer: 87 mL/min — ABNORMAL LOW (ref >90)
GFR calc non Af Amer: 75 mL/min — ABNORMAL LOW (ref >90)
Potassium: 4.4 mEq/L (ref 3.5–5.1)
Sodium: 133 mEq/L — ABNORMAL LOW (ref 135–145)

## 2012-12-14 LAB — CBC
MCHC: 36.4 g/dL — ABNORMAL HIGH (ref 30.0–36.0)
Platelets: 199 10*3/uL (ref 150–400)
RDW: 12.4 % (ref 11.5–15.5)

## 2012-12-14 MED ORDER — SODIUM CHLORIDE 0.9 % IV BOLUS (SEPSIS)
500.0000 mL | Freq: Once | INTRAVENOUS | Status: AC
  Administered 2012-12-14: 500 mL via INTRAVENOUS

## 2012-12-14 MED ORDER — RIVAROXABAN 10 MG PO TABS
10.0000 mg | ORAL_TABLET | Freq: Every day | ORAL | Status: DC
  Administered 2012-12-15: 13:00:00 10 mg via ORAL
  Filled 2012-12-14 (×2): qty 1

## 2012-12-14 NOTE — Evaluation (Addendum)
Physical Therapy Evaluation Patient Details Name: Chris Wilcox MRN: [id number] DOB: 1955/02/20 Today's Date: 12/14/2012 Time: 4401-0272 PT Time Calculation (min): 24 min  PT Assessment / Plan / Recommendation History of Present Illness     Clinical Impression  Pt presents s/p L TKA POD 1 with decreased strength, ROM and mobility.  Tolerated OOB and ambulation in hallway with min assist and no c/o dizziness/nausea.  Note when speaking with pt and wife about family set up that they have many stairs throughout the house.  Feel that he would be able to perform stairs if could set up one room for bed/bath, however will leave decision up to pt/family.  Wife also states that she is worried about amount of HHPT vs SNF.  Feel pt would benefit from SNF to ensure safety on stairs.     PT Assessment  Patient needs continued PT services    Follow Up Recommendations  Home health PT;Supervision/Assistance - 24 hour    Does the patient have the potential to tolerate intense rehabilitation      Barriers to Discharge   many stairs at home    Equipment Recommendations  Rolling walker with 5" wheels;3in1 (PT) (if home)    Recommendations for Other Services OT consult   Frequency 7X/week    Precautions / Restrictions Precautions Precautions: Knee Required Braces or Orthoses: Knee Immobilizer - Left Knee Immobilizer - Left: Discontinue once straight leg raise with < 10 degree lag Restrictions Weight Bearing Restrictions: No Other Position/Activity Restrictions: WBAT   Pertinent Vitals/Pain 3/10, ice packs applied      Mobility  Bed Mobility Bed Mobility: Supine to Sit Supine to Sit: 4: Min assist Details for Bed Mobility Assistance: Assist for LLE out of bed with min cues for hand placement on bed to self assist trunk into sitting.  Transfers Transfers: Sit to Stand;Stand to Sit Sit to Stand: 4: Min assist;From elevated surface;From bed Stand to Sit: 4: Min assist;With upper extremity  assist;With armrests;To chair/3-in-1 Details for Transfer Assistance: Min assist to steady with cues for hand placement, LE management and safety when standing.  Ambulation/Gait Ambulation/Gait Assistance: 4: Min assist Ambulation Distance (Feet): 45 Feet Assistive device: Rolling walker Ambulation/Gait Assistance Details: cues for sequencing/technique with RW, maintaining upright posture and correct turning technique to avoid pivoting on LLE.    Gait Pattern: Step-to pattern;Decreased stride length;Antalgic Gait velocity: decreased Stairs: No Wheelchair Mobility Wheelchair Mobility: No    Exercises     PT Diagnosis: Difficulty walking;Abnormality of gait;Generalized weakness;Acute pain  PT Problem List: Decreased strength;Decreased range of motion;Decreased activity tolerance;Decreased balance;Decreased mobility;Decreased coordination;Decreased knowledge of use of DME;Decreased knowledge of precautions;Pain PT Treatment Interventions: Gait training;Stair training;DME instruction;Functional mobility training;Therapeutic activities;Therapeutic exercise;Balance training;Patient/family education     PT Goals(Current goals can be found in the care plan section) Acute Rehab PT Goals Patient Stated Goal: to return home safely PT Goal Formulation: With patient/family Time For Goal Achievement: 12/17/12 Potential to Achieve Goals: Good  Visit Information  Last PT Received On: 12/14/12 Assistance Needed: +1       Prior Functioning  Home Living Family/patient expects to be discharged to:: Private residence Living Arrangements: Spouse/significant other Available Help at Discharge: Family Type of Home: House Home Access: Stairs to enter Secretary/administrator of Steps: 5 Entrance Stairs-Rails: None Home Layout: Multi-level Alternate Level Stairs-Number of Steps: 4 to bedroom and 4 to kitchen Alternate Level Stairs-Rails: None Home Equipment: Crutches Additional Comments: walk in  shower with small step, built in seat, no grab bars  Prior Function Level of Independence: Independent Communication Communication: No difficulties    Cognition  Cognition Arousal/Alertness: Awake/alert Behavior During Therapy: WFL for tasks assessed/performed Overall Cognitive Status: Within Functional Limits for tasks assessed    Extremity/Trunk Assessment Lower Extremity Assessment Lower Extremity Assessment: LLE deficits/detail LLE Deficits / Details: Pt able to initiate SLR, however unable to maintain with <10 lag, therefore donned KI during session.  AROM knee flex to approx 30 deg.  Cervical / Trunk Assessment Cervical / Trunk Assessment: Normal   Balance    End of Session PT - End of Session Equipment Utilized During Treatment: Left knee immobilizer Activity Tolerance: Patient tolerated treatment well Patient left: in chair;with call bell/phone within reach;with family/visitor present Nurse Communication: Mobility status CPM Left Knee CPM Left Knee: Off  GP     Vista Deck 12/14/2012, 10:50 AM

## 2012-12-14 NOTE — Progress Notes (Signed)
Clinical Social Work Department BRIEF PSYCHOSOCIAL ASSESSMENT 12/14/2012  Patient:  ANNETTE, LIOTTA     Account Number:  [id number]     Admit date:  12/13/2012  Clinical Social Worker:  Candie Chroman  Date/Time:  12/14/2012 02:06 PM  Referred by:  Physician  Date Referred:  12/14/2012 Referred for  SNF Placement   Other Referral:   Interview type:  Patient Other interview type:    PSYCHOSOCIAL DATA Living Status:  WIFE Admitted from facility:   Level of care:   Primary support name:  Celso Granja Primary support relationship to patient:  SPOUSE Degree of support available:   supportive    CURRENT CONCERNS Current Concerns  Post-Acute Placement   Other Concerns:    SOCIAL WORK ASSESSMENT / PLAN Pt is a 57 yr old gentleman living at home prior to hospitalization. CSW met with pt to assist with d/c planning. Pt was hoping to go home following hospital d/c but ST Rehab will be needed. Pt has accepted a bed at [facility] pending BCBS authorization. CSW will assist SNF with authorization process as needed.   Assessment/plan status:  Psychosocial Support/Ongoing Assessment of Needs Other assessment/ plan:   Information/referral to community resources:   Insurance coverage for SNF placement reviewed.    PATIENT'S/FAMILY'S RESPONSE TO PLAN OF CARE: Pt / spouse feel ST rehab would be beneficial though pt would prefer to go home.    Cori Razor LCSW [phone]

## 2012-12-14 NOTE — Progress Notes (Signed)
Inpatient Diabetes Program Recommendations  AACE/ADA: New Consensus Statement on Inpatient Glycemic Control (2013)  Target Ranges:  Prepandial:   less than 140 mg/dL      Peak postprandial:   less than 180 mg/dL (1-2 hours)      Critically ill patients:  140 - 180 mg/dL   Reason for Assessment: Glucose above target range  Results for RENN, DIROCCO (MRN [id number]) as of 12/14/2012 10:41  Ref. Range 12/14/2012 04:10  Glucose Latest Range: 70-99 mg/dL 409 (H)   Note:  Presumably patient's response to stress of surgery and use of steroids triggered an increase in glucose above target. Patient has the following risk factors for diabetes: Overweight with BMI of 30.5, Family History of DM, CAD, History of MI, Hypertension, Dyslipidemia  which could place him at risk for diabetes.   Request MD alert patient's PCP (through Discharge Summary or any other appropriate method) so appropriate follow-up regarding potential for pre-diabetes or diabetes can be pursued. Thank you. Jossalyn Forgione S. Elsie Lincoln, RN, CNS, CDE Inpatient Diabetes Program, team pager [phone]

## 2012-12-14 NOTE — Progress Notes (Addendum)
Subjective: 1 Day Post-Op Procedure(s) (LRB): LEFT TOTAL KNEE ARTHROPLASTY (Left) Patient reports pain as mild.   Patient seen in rounds with Dr. Lequita Halt. Patient is well, and has had no acute complaints or problems We will start therapy today.  Plan is to go home versus rehab after hospital stay.  Objective: Vital signs in last 24 hours: Temp:  [97.2 F (36.2 C)-98.6 F (37 C)] 98.6 F (37 C) (11/11 0515) Pulse Rate:  [65-101] 95 (11/11 0515) Resp:  [14-18] 16 (11/11 0515) BP: (92-132)/(58-88) 119/74 mmHg (11/11 0515) SpO2:  [95 %-100 %] 97 % (11/11 0515) Weight:  [102.059 kg (225 lb)] 102.059 kg (225 lb) (11/11 0025)  Intake/Output from previous day:  Intake/Output Summary (Last 24 hours) at 12/14/12 0839 Last data filed at 12/14/12 0612  Gross per 24 hour  Intake   3240 ml  Output   4252 ml  Net  -1012 ml    Intake/Output this shift: UOP 850 since MN -1012   Labs:  Recent Labs  12/14/12 0410  HGB 13.0    Recent Labs  12/14/12 0410  WBC 17.1*  RBC 4.21*  HCT 35.7*  PLT 199    Recent Labs  12/14/12 0410  NA 133*  K 4.4  CL 101  CO2 23  BUN 16  CREATININE 1.08  GLUCOSE 190*  CALCIUM 9.2   No results found for this basename: LABPT, INR,  in the last 72 hours  EXAM General - Patient is Alert, Appropriate and Oriented Extremity - Neurovascular intact Sensation intact distally Dorsiflexion/Plantar flexion intact Dressing - dressing C/D/I Motor Function - intact, moving foot and toes well on exam.  Hemovac pulled without difficulty.  Past Medical History  Diagnosis Date  . Coronary artery disease, occlusive 2008    CATH -- CTO OF RCA - unable to cross for PCI; L-R collateralization.   Marland Kitchen PAF (paroxysmal atrial fibrillation) 03/13/2011-04/11/2011    ATRIAL FIB wore Event MONITOR;  with CHADS/VASC score of 2 on anticoagulation  . Hypertension     well controlled  . Hyperlipidemia     on statin montiored by PCP  . Obesity   . Pre-diabetes      checks cbg in am  qday  . Arthritis   . Myocardial infarction 2008  . Complication of anesthesia     slow to awaken after last knee arthroscopy    Assessment/Plan: 1 Day Post-Op Procedure(s) (LRB): LEFT TOTAL KNEE ARTHROPLASTY (Left) Principal Problem:   OA (osteoarthritis) of knee Active Problems:   AF (paroxysmal atrial fibrillation)   Dyslipidemia, goal LDL below 70   Coronary artery disease, occlusive - Knonw RCA Occlusion   HTN (hypertension), benign   Obesity (BMI 30-39.9)   Hyponatremia  Estimated body mass index is 30.51 kg/(m^2) as calculated from the following:   Height as of this encounter: 6' (1.829 m).   Weight as of this encounter: 102.059 kg (225 lb). Advance diet Up with therapy Home versus rehab depending upon progress Good UOP since MN Negative on his fluids. BP was up l;ast night but running on softer side this morning. Continue on his Metoprolol due to his CAD and PAF. Also on Diltiazem and Losartan.  Will put parameters on the Losartan.  Ca+ helps with rate control so will continue the Diltiazem unless starts dropping pressures and/or symptoms.  Fluids for his pressure this morning due to soft pressure and negative fluid balance.    DVT Prophylaxis - Aspirin and Xarelto.  Placed on the 10  mg Xarelto postop.  Will increase to the 20 mg dosing when discharged home and greater than 48 hours from surgical procedure. Weight-Bearing as tolerated to left leg D/C O2 and Pulse OX and try on Room Air  Cards - Dr. Bryan Lemma - Patient has been seen preoperatively and felt to be stable for surgery.  He was instructed to stop the Xarelto 3 days prior to surgery and stop the Aspirin 5 days prior to surgery.  Both will be started back postop at the request of his cardiologist.  He will remain on his metoprolol.   Patrica Duel 12/14/2012, 8:39 AM

## 2012-12-14 NOTE — Evaluation (Signed)
Occupational Therapy Evaluation Patient Details Name: Chris Wilcox MRN: [id number] DOB: Oct 01, 1955 Today's Date: 12/14/2012 Time: 8295-6213 OT Time Calculation (min): 24 min  OT Assessment / Plan / Recommendation History of present illness L TKR   Clinical Impression   Pt did well with transferring on and off comfort height commode today. Will benefit from continued OT services to maximize ADL independence for d/c next venue. Pt and wife leaning toward going to SNF short term to increase independence.     OT Assessment  Patient needs continued OT Services    Follow Up Recommendations  SNF (per pt, would like short term SNF as he has to negotiate quite a few stairs)    Barriers to Discharge      Equipment Recommendations  3 in 1 bedside comode (if unable to borrow one)    Recommendations for Other Services    Frequency  Min 2X/week    Precautions / Restrictions Precautions Precautions: Knee Required Braces or Orthoses: Knee Immobilizer - Left Knee Immobilizer - Left: Discontinue once straight leg raise with < 10 degree lag Restrictions Weight Bearing Restrictions: No Other Position/Activity Restrictions: WBAT   Pertinent Vitals/Pain 2/10 L knee; reposition, ice    ADL  Eating/Feeding: Independent Where Assessed - Eating/Feeding: Chair Grooming: Wash/dry hands;Set up Where Assessed - Grooming: Supported sitting Upper Body Bathing: Chest;Right arm;Left arm;Abdomen;Set up Where Assessed - Upper Body Bathing: Unsupported sitting Lower Body Bathing: Minimal assistance Where Assessed - Lower Body Bathing: Supported sit to stand Upper Body Dressing: Set up Where Assessed - Upper Body Dressing: Unsupported sitting Lower Body Dressing: Minimal assistance;Moderate assistance Where Assessed - Lower Body Dressing: Supported sit to Pharmacist, hospital: Minimal Web designer: Comfort height toilet;Grab bars Toileting - Clothing Manipulation and Hygiene:  Minimal assistance Where Assessed - Engineer, mining and Hygiene: Standing Equipment Used: Rolling walker;Knee Immobilizer ADL Comments: Pt stood to use urinal with min assist. Pt leaning toward going to rehab. Discussed KI wear and how to don/doff. Also discussed 3in1 if going home as he doesnt have anything to help boost up from toilet (vanity or grab bar)    OT Diagnosis: Generalized weakness  OT Problem List: Decreased strength OT Treatment Interventions: Self-care/ADL training;DME and/or AE instruction;Therapeutic activities;Patient/family education   OT Goals(Current goals can be found in the care plan section) Acute Rehab OT Goals Patient Stated Goal: to return home safely OT Goal Formulation: With patient Time For Goal Achievement: 12/21/12 Potential to Achieve Goals: Good  Visit Information  Last OT Received On: 12/14/12 Assistance Needed: +1 History of Present Illness: L TKR       Prior Functioning     Home Living Family/patient expects to be discharged to:: Private residence Living Arrangements: Spouse/significant other Available Help at Discharge: Family Type of Home: House Home Access: Stairs to enter Secretary/administrator of Steps: 5 Entrance Stairs-Rails: None Home Layout: Multi-level Alternate Level Stairs-Number of Steps: 4 to bedroom and 4 to kitchen Alternate Level Stairs-Rails: None Home Equipment: Crutches;Shower seat - built in Additional Comments: walk in shower with small step, built in seat, no grab bars, can probably borrow a 3in1 Prior Function Level of Independence: Independent Communication Communication: No difficulties         Vision/Perception     Cognition  Cognition Arousal/Alertness: Awake/alert Behavior During Therapy: WFL for tasks assessed/performed Overall Cognitive Status: Within Functional Limits for tasks assessed    Extremity/Trunk Assessment Upper Extremity Assessment Upper Extremity Assessment:  Overall WFL for tasks assessed  Mobility  Transfers Transfers: Sit to Stand;Stand to Sit Sit to Stand: 4: Min assist;With upper extremity assist;From toilet;From chair/3-in-1 Stand to Sit: 4: Min assist;With upper extremity assist;To chair/3-in-1;To toilet Details for Transfer Assistance: Min assist to steady with cues for hand placement, LE management and safety when standing.      Exercise     Balance Balance Balance Assessed: Yes Dynamic Standing Balance Dynamic Standing - Level of Assistance: 4: Min assist   End of Session OT - End of Session Equipment Utilized During Treatment: Gait belt;Left knee immobilizer;Rolling walker Activity Tolerance: Patient tolerated treatment well Patient left: in chair;with call bell/phone within reach CPM Left Knee CPM Left Knee: Off  GO     Lennox Laity [phone] 12/14/2012, 11:22 AM

## 2012-12-14 NOTE — Progress Notes (Addendum)
Physical Therapy Treatment Patient Details Name: Chris Wilcox MRN: [id number] DOB: 12-11-1955 Today's Date: 12/14/2012 Time: 7829-5621 PT Time Calculation (min): 28 min  PT Assessment / Plan / Recommendation  History of Present Illness     PT Comments   Pt progressing well with all mobility.  Continues to have some issues with nausea, but better during session.  Tolerated exercises in bed well.   Follow Up Recommendations  Home health PT vs SNF;Supervision/Assistance - 24 hour     Does the patient have the potential to tolerate intense rehabilitation     Barriers to Discharge        Equipment Recommendations  Rolling walker with 5" wheels;3in1 (PT)    Recommendations for Other Services    Frequency 7X/week   Progress towards PT Goals Progress towards PT goals: Progressing toward goals  Plan Current plan remains appropriate    Precautions / Restrictions Precautions Precautions: Knee Required Braces or Orthoses: Knee Immobilizer - Left Knee Immobilizer - Left: Discontinue once straight leg raise with < 10 degree lag Restrictions Weight Bearing Restrictions: No Other Position/Activity Restrictions: WBAT   Pertinent Vitals/Pain 3/10, ice packs applied    Mobility  Bed Mobility Bed Mobility: Supine to Sit;Sit to Supine Supine to Sit: 4: Min assist Sit to Supine: 4: Min assist Details for Bed Mobility Assistance: Assist for LLE into and out of bed with min cues for hand placement and safety.  Transfers Transfers: Sit to Stand;Stand to Sit Sit to Stand: 4: Min assist;With upper extremity assist;From toilet;From chair/3-in-1 Stand to Sit: 4: Min assist;With upper extremity assist;To chair/3-in-1;To toilet Details for Transfer Assistance: Min assist to steady with cues for hand placement, LE management and safety when standing.  Ambulation/Gait Ambulation/Gait Assistance: 4: Min assist Ambulation Distance (Feet): 150 Feet Assistive device: Rolling walker Ambulation/Gait  Assistance Details: Cues for sequencing/technique with RW and step to vs step through gait pattern.  Unable to attempt step through due to increased pain.  Gait Pattern: Step-to pattern;Decreased stride length;Antalgic Gait velocity: decreased    Exercises Total Joint Exercises Ankle Circles/Pumps: AROM;Both;15 reps Quad Sets: AROM;Left;10 reps Heel Slides: AAROM;Left;10 reps Hip ABduction/ADduction: AAROM;Left;10 reps Straight Leg Raises: AAROM;Left;10 reps   PT Diagnosis:    PT Problem List:   PT Treatment Interventions:     PT Goals (current goals can now be found in the care plan section) Acute Rehab PT Goals PT Goal Formulation: With patient/family Time For Goal Achievement: 12/17/12 Potential to Achieve Goals: Good  Visit Information  Last PT Received On: 12/14/12 Assistance Needed: +1    Subjective Data      Cognition  Cognition Arousal/Alertness: Awake/alert Behavior During Therapy: WFL for tasks assessed/performed Overall Cognitive Status: Within Functional Limits for tasks assessed    Balance     End of Session PT - End of Session Equipment Utilized During Treatment: Left knee immobilizer Activity Tolerance: Patient tolerated treatment well Patient left: in bed;with call bell/phone within reach;with family/visitor present Nurse Communication: Mobility status CPM Left Knee CPM Left Knee: Off   GP     Vista Deck 12/14/2012, 4:52 PM

## 2012-12-14 NOTE — Progress Notes (Signed)
Clinical Social Work Department CLINICAL SOCIAL WORK PLACEMENT NOTE 12/14/2012  Patient:  [name], Chris Wilcox  Account Number:  [id number] Admit date:  12/13/2012  Clinical Social Worker:  Cori Razor, LCSW  Date/time:  12/14/2012 02:14 PM  Clinical Social Work is seeking post-discharge placement for this patient at the following level of care:   SKILLED NURSING   (*CSW will update this form in Epic as items are completed)     Patient/family provided with [facility] Department of Clinical Social Work's list of facilities offering this level of care within the geographic area requested by the patient (or if unable, by the patient's family).  12/14/2012  Patient/family informed of their freedom to choose among providers that offer the needed level of care, that participate in Medicare, Medicaid or managed care program needed by the patient, have an available bed and are willing to accept the patient.    Patient/family informed of [facility]' ownership interest in [facility], as well as of the fact that they are under no obligation to receive care at this facility.  PASARR submitted to EDS on 12/14/2012 PASARR number received from EDS on 12/14/2012  FL2 transmitted to all facilities in geographic area requested by pt/family on  12/14/2012 FL2 transmitted to all facilities within larger geographic area on   Patient informed that his/her managed care company has contracts with or will negotiate with  certain facilities, including the following:     Patient/family informed of bed offers received:  12/14/2012 Patient chooses bed at [facility] Physician recommends and patient chooses bed at    Patient to be transferred to [facility] on   Patient to be transferred to facility by   The following physician request were entered in Epic:   Additional Comments:   Cori Razor LCSW [phone]

## 2012-12-15 LAB — GLUCOSE, CAPILLARY
Glucose-Capillary: 174 mg/dL — ABNORMAL HIGH (ref 70–99)
Glucose-Capillary: 185 mg/dL — ABNORMAL HIGH (ref 70–99)
Glucose-Capillary: 205 mg/dL — ABNORMAL HIGH (ref 70–99)

## 2012-12-15 LAB — BASIC METABOLIC PANEL
CO2: 26 mEq/L (ref 19–32)
Calcium: 9.3 mg/dL (ref 8.4–10.5)
Creatinine, Ser: 1.03 mg/dL (ref 0.50–1.35)
Potassium: 4.9 mEq/L (ref 3.5–5.1)

## 2012-12-15 LAB — CBC
MCH: 30.3 pg (ref 26.0–34.0)
MCV: 86.2 fL (ref 78.0–100.0)
Platelets: 188 10*3/uL (ref 150–400)
RBC: 3.99 MIL/uL — ABNORMAL LOW (ref 4.22–5.81)

## 2012-12-15 MED ORDER — HYDROCODONE-ACETAMINOPHEN 5-325 MG PO TABS
1.0000 | ORAL_TABLET | ORAL | Status: DC | PRN
  Administered 2012-12-15 – 2012-12-16 (×2): 1 via ORAL
  Filled 2012-12-15 (×2): qty 1

## 2012-12-15 NOTE — Progress Notes (Signed)
Physical Therapy Treatment Patient Details Name: Chris Wilcox MRN: [id number] DOB: 06-08-1955 Today's Date: 12/15/2012 Time: 4098-1191 PT Time Calculation (min): 25 min  PT Assessment / Plan / Recommendation  History of Present Illness L TKR   PT Comments   POD # 2 pm session.  Pt c/o increased pain and difficulty tolerating WBing thru L LE.  Assisted OOB to amb in hallway then back to bed per pt request due to increased c/o fatigue.  Performed TKR TE's however required increased time and rest breaks.  Pt progressing slowly and plans to D/C to SNF for ST Rehab.   Follow Up Recommendations  SNF     Does the patient have the potential to tolerate intense rehabilitation     Barriers to Discharge        Equipment Recommendations  Rolling walker with 5" wheels;3in1 (PT)    Recommendations for Other Services    Frequency 7X/week   Progress towards PT Goals Progress towards PT goals: Progressing toward goals  Plan      Precautions / Restrictions Precautions Precautions: Knee Precaution Comments: Instructed pt on KI use for amb Required Braces or Orthoses: Knee Immobilizer - Left Knee Immobilizer - Left: Discontinue once straight leg raise with < 10 degree lag Restrictions Weight Bearing Restrictions: No Other Position/Activity Restrictions: WBAT    Pertinent Vitals/Pain C/o 8/10 pain ICE applied    Mobility  Bed Mobility Bed Mobility: Supine to Sit;Sit to Supine Supine to Sit: 4: Min guard;HOB flat Sit to Supine: 4: Min assist Details for Bed Mobility Assistance: Guarding assist for LLE out of bed with min cues for hand placement on bed instead of rails to better simulate home environment.  Transfers Transfers: Sit to Stand;Stand to Sit Sit to Stand: 4: Min guard;From elevated surface;With upper extremity assist;From bed Stand to Sit: 4: Min guard;With upper extremity assist;With armrests;To bed Details for Transfer Assistance: Min/guard for steadying with cues for hand  placement and LE management.  Ambulation/Gait Ambulation/Gait Assistance: 4: Min guard Ambulation Distance (Feet): 42 Feet Assistive device: Rolling walker Ambulation/Gait Assistance Details: decreased amb distance 2nd increased c/o painand fatigue Gait Pattern: Step-to pattern;Decreased stride length;Antalgic Gait velocity: decreased    Exercises   Total Knee Replacement TE's 10 reps B LE ankle pumps 10 reps knee presses 10 reps heel slides  10 reps SAQ's 10 reps SLR's 10 reps ABD Followed by ICE    PT Goals (current goals can now be found in the care plan section)    Visit Information  Last PT Received On: 12/15/12 Assistance Needed: +1 History of Present Illness: L TKR    Subjective Data      Cognition       Balance     End of Session PT - End of Session Equipment Utilized During Treatment: Left knee immobilizer Activity Tolerance: Patient limited by fatigue;Patient limited by pain Patient left: in bed;with family/visitor present;with call bell/phone within reach CPM Left Knee CPM Left Knee: On   Felecia Shelling  PTA [facility]  Acute  Rehab Pager      [phone]

## 2012-12-15 NOTE — Progress Notes (Signed)
   Subjective: 2 Days Post-Op Procedure(s) (LRB): LEFT TOTAL KNEE ARTHROPLASTY (Left) Patient reports pain as mild.   Patient seen in rounds for Dr. Lequita Halt. Patient is well, but has had some minor complaints of pain in the knee, requiring pain medications Plan is to go Skilled nursing facility after hospital stay.  Objective: Vital signs in last 24 hours: Temp:  [97.6 F (36.4 C)-98.5 F (36.9 C)] 97.6 F (36.4 C) (11/12 1417) Pulse Rate:  [82-98] 98 (11/12 1417) Resp:  [16] 16 (11/12 1417) BP: (108-130)/(69-76) 130/70 mmHg (11/12 1417) SpO2:  [93 %-96 %] 96 % (11/12 1417)  Intake/Output from previous day:  Intake/Output Summary (Last 24 hours) at 12/15/12 1610 Last data filed at 12/15/12 1300  Gross per 24 hour  Intake    840 ml  Output   2650 ml  Net  -1810 ml    Intake/Output this shift: Total I/O In: 600 [P.O.:600] Out: 800 [Urine:800]  Labs:  Recent Labs  12/14/12 0410 12/15/12 0415  HGB 13.0 12.1*    Recent Labs  12/14/12 0410 12/15/12 0415  WBC 17.1* 19.7*  RBC 4.21* 3.99*  HCT 35.7* 34.4*  PLT 199 188    Recent Labs  12/14/12 0410 12/15/12 0415  NA 133* 134*  K 4.4 4.9  CL 101 101  CO2 23 26  BUN 16 22  CREATININE 1.08 1.03  GLUCOSE 190* 204*  CALCIUM 9.2 9.3   No results found for this basename: LABPT, INR,  in the last 72 hours  EXAM General - Patient is Alert, Appropriate and Oriented Extremity - Neurovascular intact Sensation intact distally Dorsiflexion/Plantar flexion intact Dressing/Incision - clean, dry, no drainage, healing Motor Function - intact, moving foot and toes well on exam.   Past Medical History  Diagnosis Date  . Coronary artery disease, occlusive 2008    CATH -- CTO OF RCA - unable to cross for PCI; L-R collateralization.   Marland Kitchen PAF (paroxysmal atrial fibrillation) 03/13/2011-04/11/2011    ATRIAL FIB wore Event MONITOR;  with CHADS/VASC score of 2 on anticoagulation  . Hypertension     well controlled  .  Hyperlipidemia     on statin montiored by PCP  . Obesity   . Pre-diabetes     checks cbg in am  qday  . Arthritis   . Myocardial infarction 2008  . Complication of anesthesia     slow to awaken after last knee arthroscopy    Assessment/Plan: 2 Days Post-Op Procedure(s) (LRB): LEFT TOTAL KNEE ARTHROPLASTY (Left) Principal Problem:   OA (osteoarthritis) of knee Active Problems:   AF (paroxysmal atrial fibrillation)   Dyslipidemia, goal LDL below 70   Coronary artery disease, occlusive - Knonw RCA Occlusion   HTN (hypertension), benign   Obesity (BMI 30-39.9)   Hyponatremia  Estimated body mass index is 30.51 kg/(m^2) as calculated from the following:   Height as of this encounter: 6' (1.829 m).   Weight as of this encounter: 102.059 kg (225 lb). Up with therapy Plan for discharge tomorrow Discharge to SNF - Wants to go to [facility] if approved.  DVT Prophylaxis - Aspirin and Xarelto. Placed on the 10 mg Xarelto postop. Will increase to the 20 mg dosing when discharged home and greater than 48 hours from surgical procedure. Weight-Bearing as tolerated to left leg   Chris Wilcox 12/15/2012, 4:10 PM

## 2012-12-15 NOTE — Social Work (Signed)
Faxed PT/OT notes to [facility] for [facility] auth- Advised patient of this and stressed if [employer] denies SNF coverage he will need to either pay out of pocket or go home with HH/DME at d/c- patient and wife understand and are hopeful for a short SNF stay if [employer] will approve- will advise as we hear back.  Reece Levy, MSW, Theresia Majors [phone]

## 2012-12-15 NOTE — Progress Notes (Addendum)
Physical Therapy Treatment Patient Details Name: Chris Wilcox MRN: [id number] DOB: [birth year] Today's Date: 12/15/2012 Time: 0828-0902 PT Time Calculation (min): 34 min  PT Assessment / Plan / Recommendation  History of Present Illness     PT Comments   Pt nauseous upon entering room this am.  Agreeable to sit up on EOB, however once in sitting, felt the urge to vomit.  RN notified and nausea meds given through IV.  Assisted to chair to perform seated exercises.  Pt then stated he wanted to get up and ambulate in hallway.  See details below.  Progressing well.   Follow Up Recommendations  Home health PT vs SNF;Supervision/Assistance - 24 hour     Does the patient have the potential to tolerate intense rehabilitation     Barriers to Discharge        Equipment Recommendations  Rolling walker with 5" wheels;3in1 (PT)    Recommendations for Other Services    Frequency 7X/week   Progress towards PT Goals Progress towards PT goals: Progressing toward goals  Plan Current plan remains appropriate    Precautions / Restrictions Precautions Precautions: Knee Required Braces or Orthoses: Knee Immobilizer - Left Knee Immobilizer - Left: Discontinue once straight leg raise with < 10 degree lag Restrictions Weight Bearing Restrictions: No Other Position/Activity Restrictions: WBAT   Pertinent Vitals/Pain 2/10 pain, ice packs applied    Mobility  Bed Mobility Bed Mobility: Supine to Sit Supine to Sit: 4: Min guard;HOB flat Details for Bed Mobility Assistance: Guarding assist for LLE out of bed with min cues for hand placement on bed instead of rails to better simulate home environment.  Transfers Transfers: Sit to Stand;Stand to Sit Sit to Stand: 4: Min guard;From elevated surface;With upper extremity assist;From bed Stand to Sit: 4: Min guard;With upper extremity assist;With armrests;To chair/3-in-1 Details for Transfer Assistance: Min/guard for steadying with cues for hand placement  and LE management.  Ambulation/Gait Ambulation/Gait Assistance: 4: Min guard Ambulation Distance (Feet): 85 Feet Assistive device: Rolling walker Ambulation/Gait Assistance Details: Min cues for upright posture.  Note he is performing step through gait pattern at times, however needs cues for continuous movement of RW.  Gait Pattern: Step-to pattern;Decreased stride length;Antalgic Gait velocity: decreased    Exercises Total Joint Exercises Ankle Circles/Pumps: AROM;Both;15 reps Quad Sets: AROM;Left;10 reps Heel Slides: AAROM;Left;10 reps Hip ABduction/ADduction: AAROM;Left;10 reps Straight Leg Raises: AAROM;Left;10 reps   PT Diagnosis:    PT Problem List:   PT Treatment Interventions:     PT Goals (current goals can now be found in the care plan section) Acute Rehab PT Goals Patient Stated Goal: to return home safely PT Goal Formulation: With patient/family Time For Goal Achievement: 12/17/12 Potential to Achieve Goals: Good  Visit Information  Last PT Received On: 12/15/12 Assistance Needed: +1    Subjective Data  Subjective: I'm feeling a little nauseous Patient Stated Goal: to return home safely   Cognition  Cognition Arousal/Alertness: Awake/alert Behavior During Therapy: WFL for tasks assessed/performed Overall Cognitive Status: Within Functional Limits for tasks assessed    Balance     End of Session PT - End of Session Equipment Utilized During Treatment: Left knee immobilizer Activity Tolerance: Patient tolerated treatment well Patient left: in chair;with call bell/phone within reach Nurse Communication: Mobility status (nauseated)   GP     Vista Deck 12/15/2012, 9:43 AM

## 2012-12-16 LAB — CBC
HCT: 33.5 % — ABNORMAL LOW (ref 39.0–52.0)
MCH: 30.8 pg (ref 26.0–34.0)
MCHC: 35.5 g/dL (ref 30.0–36.0)
MCV: 86.8 fL (ref 78.0–100.0)
Platelets: 193 10*3/uL (ref 150–400)
RDW: 12.6 % (ref 11.5–15.5)
WBC: 18.1 10*3/uL — ABNORMAL HIGH (ref 4.0–10.5)

## 2012-12-16 LAB — GLUCOSE, CAPILLARY: Glucose-Capillary: 155 mg/dL — ABNORMAL HIGH (ref 70–99)

## 2012-12-16 MED ORDER — METOCLOPRAMIDE HCL 5 MG PO TABS: 5.0000 mg | ORAL_TABLET | Freq: Three times a day (TID) | ORAL | Status: DC | PRN

## 2012-12-16 MED ORDER — DSS 100 MG PO CAPS: 100.0000 mg | ORAL_CAPSULE | Freq: Two times a day (BID) | ORAL | Status: DC

## 2012-12-16 MED ORDER — ACETAMINOPHEN 325 MG PO TABS: 650.0000 mg | ORAL_TABLET | Freq: Four times a day (QID) | ORAL | Status: DC | PRN

## 2012-12-16 MED ORDER — ONDANSETRON HCL 4 MG PO TABS: 4.0000 mg | ORAL_TABLET | Freq: Four times a day (QID) | ORAL | Status: DC | PRN

## 2012-12-16 MED ORDER — METHOCARBAMOL 500 MG PO TABS: 500.0000 mg | ORAL_TABLET | Freq: Four times a day (QID) | ORAL | Status: AC | PRN

## 2012-12-16 MED ORDER — HYDROCODONE-ACETAMINOPHEN 5-325 MG PO TABS: 1.0000 | ORAL_TABLET | ORAL | Status: DC | PRN

## 2012-12-16 MED ORDER — POLYETHYLENE GLYCOL 3350 17 G PO PACK: 17.0000 g | PACK | Freq: Every day | ORAL | Status: DC | PRN

## 2012-12-16 MED ORDER — TRAMADOL HCL 50 MG PO TABS: 50.0000 mg | ORAL_TABLET | Freq: Four times a day (QID) | ORAL | Status: AC | PRN

## 2012-12-16 MED ORDER — RIVAROXABAN 20 MG PO TABS: 20.0000 mg | ORAL_TABLET | Freq: Every day | ORAL | Status: DC

## 2012-12-16 NOTE — Discharge Summary (Signed)
Physician Discharge Summary   Patient ID: Chris Wilcox MRN: [id number] DOB/AGE: 1955/08/02 57 y.o.  Admit date: 12/13/2012 Discharge date: 12/16/2012  Primary Diagnosis:  Osteoarthritis Left knee(s)  Admission Diagnoses:  Past Medical History  Diagnosis Date  . Coronary artery disease, occlusive 2008    CATH -- CTO OF RCA - unable to cross for PCI; L-R collateralization.   Marland Kitchen PAF (paroxysmal atrial fibrillation) 03/13/2011-04/11/2011    ATRIAL FIB wore Event MONITOR;  with CHADS/VASC score of 2 on anticoagulation  . Hypertension     well controlled  . Hyperlipidemia     on statin montiored by PCP  . Obesity   . Pre-diabetes     checks cbg in am  qday  . Arthritis   . Myocardial infarction 2008  . Complication of anesthesia     slow to awaken after last knee arthroscopy   Discharge Diagnoses:   Principal Problem:   OA (osteoarthritis) of knee Active Problems:   AF (paroxysmal atrial fibrillation)   Dyslipidemia, goal LDL below 70   Coronary artery disease, occlusive - [place] RCA Occlusion   HTN (hypertension), benign   Obesity (BMI 30-39.9)   Hyponatremia  Estimated body mass index is 30.51 kg/(m^2) as calculated from the following:   Height as of this encounter: 6' (1.829 m).   Weight as of this encounter: 102.059 kg (225 lb).  Procedure:  Procedure(s) (LRB): LEFT TOTAL KNEE ARTHROPLASTY (Left)   Consults: None  HPI: Chris Wilcox is a 56 y.o. year old male with end stage OA of his left knee with progressively worsening pain and dysfunction. He has constant pain, with activity and at rest and significant functional deficits with difficulties even with ADLs. He has had extensive non-op management including analgesics, and home exercise program, but remains in significant pain with significant dysfunction. Radiographs show severe bone on bone arthritis medial and patellofemoral. He presents now for left Total Knee Arthroplasty.   Laboratory Data: Admission on 12/13/2012    Component Date Value Range Status  . ABO/RH(D) 12/13/2012 O POS   Final  . Antibody Screen 12/13/2012 NEG   Final  . Sample Expiration 12/13/2012 12/16/2012   Final  . ABO/RH(D) 12/13/2012 O POS   Final  . Glucose-Capillary 12/13/2012 166* 70 - 99 mg/dL Final  . Comment 1 40/98/1191 Documented in Chart   Final  . Comment 2 12/13/2012 Notify RN   Final  . Glucose-Capillary 12/13/2012 167* 70 - 99 mg/dL Final  . Glucose-Capillary 12/13/2012 184* 70 - 99 mg/dL Final  . WBC 47/82/9562 17.1* 4.0 - 10.5 K/uL Final  . RBC 12/14/2012 4.21* 4.22 - 5.81 MIL/uL Final  . Hemoglobin 12/14/2012 13.0  13.0 - 17.0 g/dL Final  . HCT 13/09/6576 35.7* 39.0 - 52.0 % Final  . MCV 12/14/2012 84.8  78.0 - 100.0 fL Final  . MCH 12/14/2012 30.9  26.0 - 34.0 pg Final  . MCHC 12/14/2012 36.4* 30.0 - 36.0 g/dL Final  . RDW 46/96/2952 12.4  11.5 - 15.5 % Final  . Platelets 12/14/2012 199  150 - 400 K/uL Final  . Sodium 12/14/2012 133* 135 - 145 mEq/L Final  . Potassium 12/14/2012 4.4  3.5 - 5.1 mEq/L Final  . Chloride 12/14/2012 101  96 - 112 mEq/L Final  . CO2 12/14/2012 23  19 - 32 mEq/L Final  . Glucose, Bld 12/14/2012 190* 70 - 99 mg/dL Final  . BUN 84/13/2440 16  6 - 23 mg/dL Final  . Creatinine, Ser 12/14/2012 1.08  0.50 -  1.35 mg/dL Final  . Calcium 16/11/9602 9.2  8.4 - 10.5 mg/dL Final  . GFR calc non Af Amer 12/14/2012 75* >90 mL/min Final  . GFR calc Af Amer 12/14/2012 87* >90 mL/min Final   Comment: (NOTE)                          The eGFR has been calculated using the CKD EPI equation.                          This calculation has not been validated in all clinical situations.                          eGFR's persistently <90 mL/min signify possible Chronic Kidney                          Disease.  . Glucose-Capillary 12/13/2012 183* 70 - 99 mg/dL Final  . Comment 1 54/10/8117 Notify RN   Final  . Glucose-Capillary 12/14/2012 153* 70 - 99 mg/dL Final  . Glucose-Capillary 12/14/2012 151* 70 - 99  mg/dL Final  . Comment 1 14/78/2956 Notify RN   Final  . WBC 12/15/2012 19.7* 4.0 - 10.5 K/uL Final  . RBC 12/15/2012 3.99* 4.22 - 5.81 MIL/uL Final  . Hemoglobin 12/15/2012 12.1* 13.0 - 17.0 g/dL Final  . HCT 21/30/8657 34.4* 39.0 - 52.0 % Final  . MCV 12/15/2012 86.2  78.0 - 100.0 fL Final  . MCH 12/15/2012 30.3  26.0 - 34.0 pg Final  . MCHC 12/15/2012 35.2  30.0 - 36.0 g/dL Final  . RDW 84/69/6295 12.5  11.5 - 15.5 % Final  . Platelets 12/15/2012 188  150 - 400 K/uL Final  . Sodium 12/15/2012 134* 135 - 145 mEq/L Final  . Potassium 12/15/2012 4.9  3.5 - 5.1 mEq/L Final  . Chloride 12/15/2012 101  96 - 112 mEq/L Final  . CO2 12/15/2012 26  19 - 32 mEq/L Final  . Glucose, Bld 12/15/2012 204* 70 - 99 mg/dL Final  . BUN 28/41/3244 22  6 - 23 mg/dL Final  . Creatinine, Ser 12/15/2012 1.03  0.50 - 1.35 mg/dL Final  . Calcium 02/05/7251 9.3  8.4 - 10.5 mg/dL Final  . GFR calc non Af Amer 12/15/2012 80* >90 mL/min Final  . GFR calc Af Amer 12/15/2012 >90  >90 mL/min Final   Comment: (NOTE)                          The eGFR has been calculated using the CKD EPI equation.                          This calculation has not been validated in all clinical situations.                          eGFR's persistently <90 mL/min signify possible Chronic Kidney                          Disease.  . Glucose-Capillary 12/14/2012 155* 70 - 99 mg/dL Final  . Comment 1 66/44/0347 Notify RN   Final  . Glucose-Capillary 12/14/2012 206* 70 - 99 mg/dL Final  . Comment 1 42/59/5638 Notify RN  Final  . Glucose-Capillary 12/15/2012 174* 70 - 99 mg/dL Final  . Comment 1 16/11/9602 Notify RN   Final  . Glucose-Capillary 12/15/2012 185* 70 - 99 mg/dL Final  . WBC 54/10/8117 18.1* 4.0 - 10.5 K/uL Final  . RBC 12/16/2012 3.86* 4.22 - 5.81 MIL/uL Final  . Hemoglobin 12/16/2012 11.9* 13.0 - 17.0 g/dL Final  . HCT 14/78/2956 33.5* 39.0 - 52.0 % Final  . MCV 12/16/2012 86.8  78.0 - 100.0 fL Final  . MCH 12/16/2012  30.8  26.0 - 34.0 pg Final  . MCHC 12/16/2012 35.5  30.0 - 36.0 g/dL Final  . RDW 21/30/8657 12.6  11.5 - 15.5 % Final  . Platelets 12/16/2012 193  150 - 400 K/uL Final  . Glucose-Capillary 12/15/2012 205* 70 - 99 mg/dL Final  . Glucose-Capillary 12/15/2012 199* 70 - 99 mg/dL Final  . Comment 1 84/69/6295 Notify RN   Final  . Glucose-Capillary 12/16/2012 155* 70 - 99 mg/dL Final  . Comment 1 28/41/3244 Documented in Chart   Final  . Comment 2 12/16/2012 Notify RN   Final  Hospital Outpatient Visit on 12/03/2012  Component Date Value Range Status  . aPTT 12/03/2012 31  24 - 37 seconds Final  . WBC 12/03/2012 7.1  4.0 - 10.5 K/uL Final  . RBC 12/03/2012 5.01  4.22 - 5.81 MIL/uL Final  . Hemoglobin 12/03/2012 15.4  13.0 - 17.0 g/dL Final  . HCT 02/05/7251 42.6  39.0 - 52.0 % Final  . MCV 12/03/2012 85.0  78.0 - 100.0 fL Final  . MCH 12/03/2012 30.7  26.0 - 34.0 pg Final  . MCHC 12/03/2012 36.2* 30.0 - 36.0 g/dL Final  . RDW 66/44/0347 12.4  11.5 - 15.5 % Final  . Platelets 12/03/2012 180  150 - 400 K/uL Final  . Sodium 12/03/2012 138  135 - 145 mEq/L Final  . Potassium 12/03/2012 3.8  3.5 - 5.1 mEq/L Final  . Chloride 12/03/2012 102  96 - 112 mEq/L Final  . CO2 12/03/2012 27  19 - 32 mEq/L Final  . Glucose, Bld 12/03/2012 126* 70 - 99 mg/dL Final  . BUN 42/59/5638 26* 6 - 23 mg/dL Final  . Creatinine, Ser 12/03/2012 1.11  0.50 - 1.35 mg/dL Final  . Calcium 75/64/3329 9.6  8.4 - 10.5 mg/dL Final  . Total Protein 12/03/2012 6.6  6.0 - 8.3 g/dL Final  . Albumin 51/88/4166 3.9  3.5 - 5.2 g/dL Final  . AST 08/03/1599 20  0 - 37 U/L Final  . ALT 12/03/2012 19  0 - 53 U/L Final  . Alkaline Phosphatase 12/03/2012 93  39 - 117 U/L Final  . Total Bilirubin 12/03/2012 0.8  0.3 - 1.2 mg/dL Final  . GFR calc non Af Amer 12/03/2012 73* >90 mL/min Final  . GFR calc Af Amer 12/03/2012 85* >90 mL/min Final   Comment: (NOTE)                          The eGFR has been calculated using the CKD EPI  equation.                          This calculation has not been validated in all clinical situations.                          eGFR's persistently <90 mL/min signify possible Chronic Kidney  Disease.  Marland Kitchen Prothrombin Time 12/03/2012 15.0  11.6 - 15.2 seconds Final  . INR 12/03/2012 1.21  0.00 - 1.49 Final  . Color, Urine 12/03/2012 YELLOW  YELLOW Final  . APPearance 12/03/2012 CLEAR  CLEAR Final  . Specific Gravity, Urine 12/03/2012 1.024  1.005 - 1.030 Final  . pH 12/03/2012 6.5  5.0 - 8.0 Final  . Glucose, UA 12/03/2012 NEGATIVE  NEGATIVE mg/dL Final  . Hgb urine dipstick 12/03/2012 MODERATE* NEGATIVE Final  . Bilirubin Urine 12/03/2012 NEGATIVE  NEGATIVE Final  . Ketones, ur 12/03/2012 NEGATIVE  NEGATIVE mg/dL Final  . Protein, ur 96/05/5407 NEGATIVE  NEGATIVE mg/dL Final  . Urobilinogen, UA 12/03/2012 1.0  0.0 - 1.0 mg/dL Final  . Nitrite 81/19/1478 NEGATIVE  NEGATIVE Final  . Leukocytes, UA 12/03/2012 NEGATIVE  NEGATIVE Final  . MRSA, PCR 12/03/2012 INVALID RESULTS, SPECIMEN SENT FOR CULTURE* NEGATIVE Final   Comment: RESULT CALLED TO, READ BACK BY AND VERIFIED WITH:                          AFTER HOURS 1824 12/03/12 T LOVE  . Staphylococcus aureus 12/03/2012 INVALID RESULTS, SPECIMEN SENT FOR CULTURE* NEGATIVE Final   Comment:                                 The Xpert SA Assay (FDA                          approved for NASAL specimens                          in patients over 62 years of age),                          is one component of                          a comprehensive surveillance                          program.  Test performance has                          been validated by [employer] for patients greater                          than or equal to 60 year old.                          It is not intended                          to diagnose infection nor to                          guide or monitor treatment.  . RBC  / HPF 12/03/2012 11-20  <3 RBC/hpf Final  . Specimen Description 12/03/2012 NOSE   Final  . Special Requests 12/03/2012  NONE   Final  . Culture 12/03/2012    Final                   Value:NO STAPHYLOCOCCUS AUREUS ISOLATED                         Note: NOMRSA                         Performed at [facility]  . Report Status 12/03/2012 12/06/2012 FINAL   Final     X-Rays:Dg Chest 2 View  12/03/2012   CLINICAL DATA:  Left knee osteoarthritis. Hypertension. Pre-op respiratory exam.  EXAM: CHEST  2 VIEW  COMPARISON:  01/19/2007  FINDINGS: The heart size and mediastinal contours are within normal limits. Both lungs are clear. The visualized skeletal structures are unremarkable.  IMPRESSION: No active cardiopulmonary disease.   Electronically Signed   By: Myles Rosenthal M.D.   On: 12/03/2012 14:50    EKG: Orders placed during the hospital encounter of 12/03/12  . EKG 12-LEAD  . EKG 12-LEAD     Hospital Course: Chris Wilcox is a 57 y.o. who was admitted to [facility]. They were brought to the operating room on 12/13/2012 and underwent Procedure(s): LEFT TOTAL KNEE ARTHROPLASTY.  Patient tolerated the procedure well and was later transferred to the recovery room and then to the orthopaedic floor for postoperative care.  They were given PO and IV analgesics for pain control following their surgery.  They were given 24 hours of postoperative antibiotics of  Anti-infectives   Start     Dose/Rate Route Frequency Ordered Stop   12/13/12 1400  ceFAZolin (ANCEF) IVPB 2 g/50 mL premix     2 g 100 mL/hr over 30 Minutes Intravenous Every 6 hours 12/13/12 1223 12/13/12 1958   12/13/12 0700  ceFAZolin (ANCEF) IVPB 2 g/50 mL premix     2 g 100 mL/hr over 30 Minutes Intravenous On call to O.R. 12/13/12 1478 12/13/12 0808     and started on DVT prophylaxis in the form of Aspirin and Xarelto. Social worker consulted postop to look into possiblity of SNR for inpatient rehab  PT and OT were ordered  for total joint protocol.  Discharge planning consulted to help with postop disposition and equipment needs.  Patient had a decent night on the evening of surgery.  They started to get up OOB with therapy on day one walking over 40 feet. Hemovac drain was pulled without difficulty.  Continued to work with therapy into day two.  Dressing was changed on day two and the incision was healing well.  By day three, the patient had progressed with therapy and meeting their goals.  Incision was healing well.  Patient was seen in rounds and was ready to go to [facility] later today.   Discharge Medications: Prior to Admission medications   Medication Sig Start Date End Date Taking? Authorizing Provider  aspirin EC 81 MG tablet Take 81 mg by mouth daily.   Yes Historical Provider, MD  atorvastatin (LIPITOR) 40 MG tablet Take 40 mg by mouth every evening.    Yes Historical Provider, MD  diltiazem (DILT-XR) 240 MG 24 hr capsule Take 240 mg by mouth every morning.    Yes Historical Provider, MD  losartan (COZAAR) 50 MG tablet Take 50 mg by mouth at bedtime.    Yes Historical Provider, MD  metoprolol (LOPRESSOR) 50 MG  tablet Take 25 mg by mouth 2 (two) times daily. Take 1/2 tablet   Yes Historical Provider, MD  acetaminophen (TYLENOL) 325 MG tablet Take 2 tablets (650 mg total) by mouth every 6 (six) hours as needed for mild pain (or Fever >/= 101). 12/16/12   Alexzandrew Perkins, PA-C  docusate sodium 100 MG CAPS Take 100 mg by mouth 2 (two) times daily. 12/16/12   Alexzandrew Julien Girt, PA-C  HYDROcodone-acetaminophen (NORCO/VICODIN) 5-325 MG per tablet Take 1-2 tablets by mouth every 4 (four) hours as needed for moderate pain. 12/16/12   Alexzandrew Perkins, PA-C  methocarbamol (ROBAXIN) 500 MG tablet Take 1 tablet (500 mg total) by mouth every 6 (six) hours as needed for muscle spasms. 12/16/12   Alexzandrew Julien Girt, PA-C  metoCLOPramide (REGLAN) 5 MG tablet Take 1-2 tablets (5-10 mg total) by mouth every 8  (eight) hours as needed for nausea (if ondansetron (ZOFRAN) ineffective.). 12/16/12   Alexzandrew Perkins, PA-C  nitroGLYCERIN (NITROSTAT) 0.4 MG SL tablet Place 0.4 mg under the tongue every 5 (five) minutes as needed for chest pain.    Historical Provider, MD  ondansetron (ZOFRAN) 4 MG tablet Take 1 tablet (4 mg total) by mouth every 6 (six) hours as needed for nausea. 12/16/12   Alexzandrew Perkins, PA-C  polyethylene glycol (MIRALAX / GLYCOLAX) packet Take 17 g by mouth daily as needed for mild constipation. 12/16/12   Alexzandrew Julien Girt, PA-C  Rivaroxaban (XARELTO) 20 MG TABS tablet Take 1 tablet (20 mg total) by mouth daily with supper. 12/16/12   Alexzandrew Perkins, PA-C  traMADol (ULTRAM) 50 MG tablet Take 1-2 tablets (50-100 mg total) by mouth every 6 (six) hours as needed (mild pain). 12/16/12   Alexzandrew Julien Girt, PA-C   Discharge to SNF - [facility]  Diet - Cardiac diet  Follow up - in 2 weeks on Tuesday 12/28/2012  Activity - WBAT  Disposition - Skilled nursing facility - [facility]  Condition Upon Discharge - Good  D/C Meds - See DC Summary  DVT Prophylaxis - Aspirin and Xarelto. Placed on the 10 mg Xarelto postop. Will increase to the 20 mg dosing when discharged to [facility].   Discharge Orders   Future Orders Complete By Expires   Call MD / Call 911  As directed    Comments:     If you experience chest pain or shortness of breath, CALL 911 and be transported to the hospital emergency room.  If you develope a fever above 101 F, pus (white drainage) or increased drainage or redness at the wound, or calf pain, call your surgeon's office.   Change dressing  As directed    Comments:     Change dressing daily with sterile 4 x 4 inch gauze dressing and apply TED hose. Do not submerge the incision under water.   Constipation Prevention  As directed    Comments:     Drink plenty of fluids.  Prune juice may be helpful.  You may use a stool softener, such as Colace (over  the counter) 100 mg twice a day.  Use MiraLax (over the counter) for constipation as needed.   Diet - low sodium heart healthy  As directed    Discharge instructions  As directed    Comments:     Pick up stool softner and laxative for home. Do not submerge incision under water. May shower. Continue to use ice for pain and swelling from surgery.  He has been started back on the Xarelto 20 mg tablet  at time of discharge and he will also continue on his Aspirin as he takes at home.   Do not put a pillow under the knee. Place it under the heel.  As directed    Do not sit on low chairs, stoools or toilet seats, as it may be difficult to get up from low surfaces  As directed    Driving restrictions  As directed    Comments:     No driving until released by the physician.   Increase activity slowly as tolerated  As directed    Lifting restrictions  As directed    Comments:     No lifting until released by the physician.   Patient may shower  As directed    Comments:     You may shower without a dressing once there is no drainage.  Do not wash over the wound.  If drainage remains, do not shower until drainage stops.   TED hose  As directed    Comments:     Use stockings (TED hose) for 3 weeks on both leg(s).  You may remove them at night for sleeping.   Weight bearing as tolerated  As directed    Questions:     Laterality:     Extremity:         Medication List    STOP taking these medications       Fish Oil 1000 MG Caps     Glucosamine 500 MG Caps     naproxen sodium 220 MG tablet  Commonly known as:  ANAPROX      TAKE these medications       acetaminophen 325 MG tablet  Commonly known as:  TYLENOL  Take 2 tablets (650 mg total) by mouth every 6 (six) hours as needed for mild pain (or Fever >/= 101).     aspirin EC 81 MG tablet  Take 81 mg by mouth daily.     atorvastatin 40 MG tablet  Commonly known as:  LIPITOR  Take 40 mg by mouth every evening.     DILT-XR 240 MG  24 hr capsule  Generic drug:  diltiazem  Take 240 mg by mouth every morning.     DSS 100 MG Caps  Take 100 mg by mouth 2 (two) times daily.     HYDROcodone-acetaminophen 5-325 MG per tablet  Commonly known as:  NORCO/VICODIN  Take 1-2 tablets by mouth every 4 (four) hours as needed for moderate pain.     losartan 50 MG tablet  Commonly known as:  COZAAR  Take 50 mg by mouth at bedtime.     methocarbamol 500 MG tablet  Commonly known as:  ROBAXIN  Take 1 tablet (500 mg total) by mouth every 6 (six) hours as needed for muscle spasms.     metoCLOPramide 5 MG tablet  Commonly known as:  REGLAN  Take 1-2 tablets (5-10 mg total) by mouth every 8 (eight) hours as needed for nausea (if ondansetron (ZOFRAN) ineffective.).     metoprolol 50 MG tablet  Commonly known as:  LOPRESSOR  Take 25 mg by mouth 2 (two) times daily. Take 1/2 tablet     nitroGLYCERIN 0.4 MG SL tablet  Commonly known as:  NITROSTAT  Place 0.4 mg under the tongue every 5 (five) minutes as needed for chest pain.     ondansetron 4 MG tablet  Commonly known as:  ZOFRAN  Take 1 tablet (4 mg total) by mouth every 6 (six) hours  as needed for nausea.     polyethylene glycol packet  Commonly known as:  MIRALAX / GLYCOLAX  Take 17 g by mouth daily as needed for mild constipation.     Rivaroxaban 20 MG Tabs tablet  Commonly known as:  XARELTO  Take 1 tablet (20 mg total) by mouth daily with supper.     traMADol 50 MG tablet  Commonly known as:  ULTRAM  Take 1-2 tablets (50-100 mg total) by mouth every 6 (six) hours as needed (mild pain).           Follow-up Information   Follow up with Loanne Drilling, MD. Schedule an appointment as soon as possible for a visit on 12/28/2012.   Specialty:  Orthopedic Surgery   Contact information:   [address] [place] [address] [phone]       Signed: Patrica Duel 12/16/2012, 9:33 AM

## 2012-12-16 NOTE — Progress Notes (Signed)
Clinical Social Work Department CLINICAL SOCIAL WORK PLACEMENT NOTE 12/16/2012  Patient:  Chris Wilcox, Chris Wilcox  Account Number:  [id number] Admit date:  12/13/2012  Clinical Social Worker:  Cori Razor, LCSW  Date/time:  12/14/2012 02:14 PM  Clinical Social Work is seeking post-discharge placement for this patient at the following level of care:   SKILLED NURSING   (*CSW will update this form in Epic as items are completed)     Patient/family provided with [facility] Department of Clinical Social Work's list of facilities offering this level of care within the geographic area requested by the patient (or if unable, by the patient's family).  12/14/2012  Patient/family informed of their freedom to choose among providers that offer the needed level of care, that participate in Medicare, Medicaid or managed care program needed by the patient, have an available bed and are willing to accept the patient.    Patient/family informed of [facility]' ownership interest in [facility], as well as of the fact that they are under no obligation to receive care at this facility.  PASARR submitted to [employer] on 12/14/2012 PASARR number received from [employer] on 12/14/2012  FL2 transmitted to all facilities in geographic area requested by pt/family on  12/14/2012 FL2 transmitted to all facilities within larger geographic area on   Patient informed that his/her managed care company has contracts with or will negotiate with  certain facilities, including the following:     Patient/family informed of bed offers received:  12/14/2012 Patient chooses bed at [facility] Physician recommends and patient chooses bed at    Patient to be transferred to [facility] on  12/16/2012 Patient to be transferred to facility by FAMILY  The following physician request were entered in Epic:   Additional Comments:  [employer] gave prior approval for SNF placement. Cori Razor LCSW [phone]

## 2012-12-16 NOTE — Progress Notes (Signed)
Subjective: 3 Days Post-Op Procedure(s) (LRB): LEFT TOTAL KNEE ARTHROPLASTY (Left) Patient reports pain as mild.   Patient seen in rounds with Dr. Lequita Halt. Patient is well, and has had no acute complaints or problems Patient is ready to go to [facility].  Patient states he was notified that he received insurance approval.  Objective: Vital signs in last 24 hours: Temp:  [97.6 F (36.4 C)-99 F (37.2 C)] 98 F (36.7 C) (11/13 0557) Pulse Rate:  [80-98] 80 (11/13 0557) Resp:  [16] 16 (11/13 0557) BP: (112-131)/(69-85) 131/85 mmHg (11/13 0557) SpO2:  [95 %-96 %] 95 % (11/13 0557)  Intake/Output from previous day:  Intake/Output Summary (Last 24 hours) at 12/16/12 0915 Last data filed at 12/16/12 0749  Gross per 24 hour  Intake    960 ml  Output   1800 ml  Net   -840 ml    Intake/Output this shift: Total I/O In: 360 [P.O.:360] Out: -   Labs:  Recent Labs  12/14/12 0410 12/15/12 0415 12/16/12 0418  HGB 13.0 12.1* 11.9*    Recent Labs  12/15/12 0415 12/16/12 0418  WBC 19.7* 18.1*  RBC 3.99* 3.86*  HCT 34.4* 33.5*  PLT 188 193    Recent Labs  12/14/12 0410 12/15/12 0415  NA 133* 134*  K 4.4 4.9  CL 101 101  CO2 23 26  BUN 16 22  CREATININE 1.08 1.03  GLUCOSE 190* 204*  CALCIUM 9.2 9.3   No results found for this basename: LABPT, INR,  in the last 72 hours  EXAM: General - Patient is Alert, Appropriate and Oriented Extremity - Neurovascular intact Sensation intact distally Dorsiflexion/Plantar flexion intact Incision - clean, dry, no drainage, healing Motor Function - intact, moving foot and toes well on exam.   Assessment/Plan: 3 Days Post-Op Procedure(s) (LRB): LEFT TOTAL KNEE ARTHROPLASTY (Left) Procedure(s) (LRB): LEFT TOTAL KNEE ARTHROPLASTY (Left) Past Medical History  Diagnosis Date  . Coronary artery disease, occlusive 2008    CATH -- CTO OF RCA - unable to cross for PCI; L-R collateralization.   Marland Kitchen PAF (paroxysmal atrial  fibrillation) 03/13/2011-04/11/2011    ATRIAL FIB wore Event MONITOR;  with CHADS/VASC score of 2 on anticoagulation  . Hypertension     well controlled  . Hyperlipidemia     on statin montiored by PCP  . Obesity   . Pre-diabetes     checks cbg in am  qday  . Arthritis   . Myocardial infarction 2008  . Complication of anesthesia     slow to awaken after last knee arthroscopy   Principal Problem:   OA (osteoarthritis) of knee Active Problems:   AF (paroxysmal atrial fibrillation)   Dyslipidemia, goal LDL below 70   Coronary artery disease, occlusive - Knonw RCA Occlusion   HTN (hypertension), benign   Obesity (BMI 30-39.9)   Hyponatremia  Estimated body mass index is 30.51 kg/(m^2) as calculated from the following:   Height as of this encounter: 6' (1.829 m).   Weight as of this encounter: 102.059 kg (225 lb). Up with therapy Discharge to SNF  - [facility] Diet - Cardiac diet Follow up - in 2 weeks on Tuesday 12/28/2012 Activity - WBAT Disposition - Skilled nursing facility - [facility] Condition Upon Discharge - Good D/C Meds - See DC Summary DVT Prophylaxis - Aspirin and Xarelto. Placed on the 10 mg Xarelto postop. Will increase to the 20 mg dosing when discharged to [facility].   Chris Wilcox 12/16/2012, 9:15 AM

## 2012-12-16 NOTE — Progress Notes (Addendum)
Physical Therapy Treatment Patient Details Name: Chris Wilcox MRN: [id number] DOB: May 31, 1955 Today's Date: 12/16/2012 Time: 0912-0957 PT Time Calculation (min): 45 min  PT Assessment / Plan / Recommendation  History of Present Illness L TKR   PT Comments   POD # 3 assisted pt OOb to amb in hallway then back to bed to perform TKR TE's.  Pt was able to perform 10 active SLR so instructed pt on D/C KI.  Also instructed on use of ICE and to avoid any kneeling on his L knee.   Follow Up Recommendations  SNF     Does the patient have the potential to tolerate intense rehabilitation     Barriers to Discharge        Equipment Recommendations       Recommendations for Other Services    Frequency     Progress towards PT Goals Progress towards PT goals: Progressing toward goals  Plan Current plan remains appropriate    Precautions / Restrictions Precautions Precautions: Knee Precaution Comments: Pt able to perform 10 active SLR so instructed to pt to  D/C KI Restrictions Weight Bearing Restrictions: No Other Position/Activity Restrictions: WBAT   Pertinent Vitals/Pain C/o 3/10 during TE's Applied ICE    Mobility  Bed Mobility Bed Mobility: Supine to Sit;Sit to Supine Supine to Sit: 6: Modified independent (Device/Increase time) Sit to Supine: 6: Modified independent (Device/Increase time) Details for Bed Mobility Assistance: increased time Transfers Transfers: Sit to Stand;Stand to Sit Sit to Stand: 5: Supervision;4: Min guard;From bed Stand to Sit: 5: Supervision;4: Min guard;To bed Details for Transfer Assistance: Min/guard for steadying with cues for hand placement and LE management.  Ambulation/Gait Ambulation/Gait Assistance: 4: Min guard Ambulation Distance (Feet): 122 Feet Assistive device: Rolling walker Ambulation/Gait Assistance Details: 25% VC's to increase knee flexion and heel strike along with toe off.   Gait Pattern: Step-through pattern Gait velocity:  decreased    Exercises   Total Knee Replacement TE's 10 reps B LE ankle pumps 10 reps knee presses 10 reps heel slides  10 reps SAQ's 10 reps SLR's 10 reps ABD Followed by ICE     PT Goals (current goals can now be found in the care plan section)    Visit Information  Last PT Received On: 12/16/12 Assistance Needed: +1 History of Present Illness: L TKR    Subjective Data      Cognition       Balance     End of Session PT - End of Session Equipment Utilized During Treatment: Gait belt Activity Tolerance: Patient limited by pain Patient left: in chair;with call bell/phone within reach   Felecia Shelling  PTA [facility]  Acute  Rehab Pager      [phone]

## 2012-12-17 NOTE — Progress Notes (Signed)
Utilization review completed.  

## 2012-12-22 ENCOUNTER — Non-Acute Institutional Stay (SKILLED_NURSING_FACILITY): Payer: BC Managed Care – PPO | Admitting: Internal Medicine

## 2012-12-22 DIAGNOSIS — I480 Paroxysmal atrial fibrillation: Secondary | ICD-10-CM

## 2012-12-22 DIAGNOSIS — I219 Acute myocardial infarction, unspecified: Secondary | ICD-10-CM

## 2012-12-22 DIAGNOSIS — I2510 Atherosclerotic heart disease of native coronary artery without angina pectoris: Secondary | ICD-10-CM

## 2012-12-22 DIAGNOSIS — I4891 Unspecified atrial fibrillation: Secondary | ICD-10-CM

## 2012-12-22 DIAGNOSIS — K5900 Constipation, unspecified: Secondary | ICD-10-CM

## 2012-12-22 DIAGNOSIS — M179 Osteoarthritis of knee, unspecified: Secondary | ICD-10-CM

## 2012-12-22 DIAGNOSIS — M1710 Unilateral primary osteoarthritis, unspecified knee: Secondary | ICD-10-CM

## 2013-01-14 ENCOUNTER — Telehealth: Payer: Self-pay | Admitting: *Deleted

## 2013-01-14 MED ORDER — EZETIMIBE 10 MG PO TABS: 10.0000 mg | ORAL_TABLET | Freq: Every day | ORAL | Status: DC

## 2013-01-14 NOTE — Telephone Encounter (Signed)
Message copied by Tobin Chad on Fri Jan 14, 2013 10:13 AM ------      Message from: Herbie Baltimore, DAVID W      Created: Fri Jan 07, 2013  5:36 PM       Labs overall look pretty good, but LDL actually went up along with HDL.       We are not quite at LDL goal.  Would consider adding Zetia 10 mg.            I am not sure if I ordered these labs, or his PCP.  If it was me, we can try to start Zetia.            Marykay Lex, MD       ------

## 2013-01-14 NOTE — Telephone Encounter (Signed)
Spoke to patient. Result given . Verbalized understanding Patient states primary did not adjust his medication-- for LDL of 104  Informed patient Dr Herbie Baltimore would like LDL to be closer to 70 as possible- start Zetia 10 mg daily Sent prescription to [facility] [facility]  #30 x 11. Mailed a saving card to patient.

## 2013-02-07 ENCOUNTER — Encounter: Payer: Self-pay | Admitting: Internal Medicine

## 2013-02-07 DIAGNOSIS — K5900 Constipation, unspecified: Secondary | ICD-10-CM | POA: Insufficient documentation

## 2013-02-07 NOTE — Progress Notes (Signed)
Patient ID: Chris Wilcox, male   DOB: Apr 27, 1955, 58 y.o.   MRN: [id number]        HISTORY & PHYSICAL  DATE: 12/22/2012     FACILITY: [facility]  LEVEL OF CARE: SNF (31)  ALLERGIES:  Allergies  Allergen Reactions  . Codeine Nausea Only    CHIEF COMPLAINT:  Manage left knee osteoarthritis, CAD, and atrial fibrillation.    HISTORY OF PRESENT ILLNESS:  The patient is a 58 year-old, Caucasian male.    KNEE OSTEOARTHRITIS: Patient had a history of pain and functional disability in the knee due to end-stage osteoarthritis and has failed nonsurgical conservative treatments. Patient had worsening of pain with activity and weight bearing, pain that interfered with activities of daily living & pain with passive range of motion.  Radiographs showed tibia bone-on-bone arthritis in the medial and patellofemoral compartments.  Therefore patient underwent total knee arthroplasty and tolerated the procedure well. Patient is admitted to this facility for sort short-term rehabilitation. Patient denies knee pain.    CAD: The angina has been stable. The patient denies dyspnea on exertion, orthopnea, pedal edema, palpitations and paroxysmal nocturnal dyspnea. No complications noted from the medication presently being used.    ATRIAL FIBRILLATION: the patients atrial fibrillation remains stable.  The patient denies DOE, tachycardia, orthopnea, transient neurological sx, pedal edema, palpitations, & PNDs.  No complications noted from the medications currently being used.    PAST MEDICAL HISTORY :  Past Medical History  Diagnosis Date  . Coronary artery disease, occlusive 2008    CATH -- CTO OF RCA - unable to cross for PCI; L-R collateralization.   Marland Kitchen PAF (paroxysmal atrial fibrillation) 03/13/2011-04/11/2011    ATRIAL FIB wore Event MONITOR;  with CHADS/VASC score of 2 on anticoagulation  . Hypertension     well controlled  . Hyperlipidemia     on statin montiored by PCP  . Obesity   .  Pre-diabetes     checks cbg in am  qday  . Arthritis   . Myocardial infarction 2008  . Complication of anesthesia     slow to awaken after last knee arthroscopy    PAST SURGICAL HISTORY: Past Surgical History  Procedure Laterality Date  . Doppler echocardiography  06/10/2011    EF =>55% -ATRIAL FIB,BORDERLINE BILEAFLET MITRAL VALVE PROLAPSE,MILD TO MOD MITARL INSUFF;MILD AORTIC INSUFF  . Nm myocar perf wall motion  03/13/2011    LEXISCAN--EF 55% LV MILDLY REDUCED;BASAL INFERIOR AND MID INFERIOR SCAR  . Cardiac catheterization  01/20/2007    unsuccessful PCI of RCA  . Index finger surgery Left 2009  . Arthroscopy knee w/ drilling Left yrs ago    x 2  . Total knee arthroplasty Left 12/13/2012    Procedure: LEFT TOTAL KNEE ARTHROPLASTY;  Surgeon: Loanne Drilling, MD;  Location: [facility];  Service: Orthopedics;  Laterality: Left;    SOCIAL HISTORY:  reports that he has never smoked. He has never used smokeless tobacco. He reports that he does not drink alcohol or use illicit drugs.  FAMILY HISTORY:  Family History  Problem Relation Age of Onset  . Diabetes Mother   . Diabetes Father   . Diabetes Maternal Grandmother   . Stroke Maternal Grandfather   . Cancer Paternal Grandmother     CURRENT MEDICATIONS: Reviewed per [facility]  REVIEW OF SYSTEMS:  See HPI otherwise 14 point ROS is negative.  PHYSICAL EXAMINATION  VS:  T 97.8       P 95  RR 20      BP 120/83      POX 95%        WT (Lb)  GENERAL: no acute distress, normal body habitus SKIN:  INSPECTION: left knee incision clean and dry, Steri-Strips in place; all other skin areas are normal   EYES: conjunctivae normal, sclerae normal, normal eye lids MOUTH/THROAT: lips without lesions,no lesions in the mouth,tongue is without lesions,uvula elevates in midline NECK: supple, trachea midline, no neck masses, no thyroid tenderness, no thyromegaly LYMPHATICS: no LAN in the neck, no supraclavicular LAN RESPIRATORY: breathing is  even & unlabored, BS CTAB CARDIAC: heart rate is irregular irregular, no murmur,no extra heart sounds EDEMA/VARICOSITIES: left lower extremity has +1 edema   GI:  ABDOMEN: abdomen soft, normal BS, no masses, no tenderness  LIVER/SPLEEN: no hepatomegaly, no splenomegaly MUSCULOSKELETAL: HEAD: normal to inspection & palpation BACK: no kyphosis, scoliosis or spinal processes tenderness EXTREMITIES: LEFT UPPER EXTREMITY: full range of motion, normal strength & tone RIGHT UPPER EXTREMITY:  full range of motion, normal strength & tone LEFT LOWER EXTREMITY: strength intact, range of motion not tested due to surgery   RIGHT LOWER EXTREMITY:  full range of motion, normal strength & tone PSYCHIATRIC: the patient is alert & oriented to person, affect & behavior appropriate  LABS/RADIOLOGY: PTT 31.    Urinalysis negative.    MRSA by PCR negative.     Staph aureus by PCR negative.    Chest x-Matej:  No acute disease.    Labs reviewed: Basic Metabolic Panel:  Recent Labs  16/11/9608/31/14 1345 12/14/12 0410 12/15/12 0415  NA 138 133* 134*  K 3.8 4.4 4.9  CL 102 101 101  CO2 27 23 26   GLUCOSE 126* 190* 204*  BUN 26* 16 22  CREATININE 1.11 1.08 1.03  CALCIUM 9.6 9.2 9.3   Liver Function Tests:  Recent Labs  12/03/12 1345  AST 20  ALT 19  ALKPHOS 93  BILITOT 0.8  PROT 6.6  ALBUMIN 3.9    CBC:  Recent Labs  12/14/12 0410 12/15/12 0415 12/16/12 0418  WBC 17.1* 19.7* 18.1*  HGB 13.0 12.1* 11.9*  HCT 35.7* 34.4* 33.5*  MCV 84.8 86.2 86.8  PLT 199 188 193    CBG:  Recent Labs  12/15/12 1731 12/15/12 2038 12/16/12 0728  GLUCAP 205* 199* 155*    ASSESSMENT/PLAN:  Left knee osteoarthritis.  Status post left total knee arthroplasty.  Continue rehabilitation.    CAD.  Stable.    Atrial fibrillation.  Rate controlled.     Constipation.  MiraLAX was started.    Hypertension.  Well controlled.    Check CBC and BMP.    THN Metrics:   Aspirin 81 mg q.d.    I  have reviewed patient's medical records received at admission/from hospitalization.  CPT CODE: [id number]

## 2013-03-10 ENCOUNTER — Other Ambulatory Visit: Payer: Self-pay | Admitting: Adult Health

## 2013-03-16 ENCOUNTER — Telehealth: Payer: Self-pay | Admitting: Cardiology

## 2013-03-16 MED ORDER — RIVAROXABAN 20 MG PO TABS: 20.0000 mg | ORAL_TABLET | Freq: Every day | ORAL | Status: DC

## 2013-03-16 NOTE — Telephone Encounter (Signed)
Pt called and said pharmacist said that his Xarelto was denied by this office. He needs his medicine called in today.

## 2013-03-16 NOTE — Telephone Encounter (Signed)
Rx was sent to pharmacy electronically. Left message for patient notifying him refills were sent in.

## 2013-04-07 ENCOUNTER — Encounter: Payer: Self-pay | Admitting: Cardiology

## 2013-04-07 ENCOUNTER — Ambulatory Visit (INDEPENDENT_AMBULATORY_CARE_PROVIDER_SITE_OTHER): Payer: BC Managed Care – PPO | Admitting: Cardiology

## 2013-04-07 VITALS — BP 128/74 | HR 80 | Ht 72.0 in | Wt 218.7 lb

## 2013-04-07 DIAGNOSIS — I480 Paroxysmal atrial fibrillation: Secondary | ICD-10-CM

## 2013-04-07 DIAGNOSIS — E785 Hyperlipidemia, unspecified: Secondary | ICD-10-CM

## 2013-04-07 DIAGNOSIS — E669 Obesity, unspecified: Secondary | ICD-10-CM

## 2013-04-07 DIAGNOSIS — I10 Essential (primary) hypertension: Secondary | ICD-10-CM

## 2013-04-07 DIAGNOSIS — I2510 Atherosclerotic heart disease of native coronary artery without angina pectoris: Secondary | ICD-10-CM

## 2013-04-07 DIAGNOSIS — I4891 Unspecified atrial fibrillation: Secondary | ICD-10-CM

## 2013-04-07 NOTE — Patient Instructions (Signed)
Well after surgery. Have received your now back exercising and has lost some weight.  He could go back to yearly visits. I'll be following up on your lipid levels to see if we can potentially back off on the Zetia.  Marykay LexHARDING,Veida Spira W, MD  Your physician wants you to follow-up in: 1 yr.  You will receive a reminder letter in the mail two months in advance. If you don't receive a letter, please call our office to schedule the follow-up appointment.

## 2013-04-07 NOTE — Assessment & Plan Note (Signed)
He continues to be stable with no active symptoms. He is back exercising without any angina or heart failure symptoms. He is on aspirin, statin, beta blocker and ARB.

## 2013-04-07 NOTE — Assessment & Plan Note (Signed)
On his recent lipid panel we started him on Zetia. He is noting the cost-effective Zetia. I think he can probably be okay without it depending on his next 2 sets of lipids. He apparently just had lipids checked by his PCP earlier this week with the results of.

## 2013-04-07 NOTE — Assessment & Plan Note (Signed)
Asymptomatic. Rate controlled on beta blocker and calcium channel blocker. Anticoagulated with Xarelto.

## 2013-04-07 NOTE — Assessment & Plan Note (Signed)
He has lost a significant weight since her last visit and since he's been able to go back to exercising. He is also quite cognizant of his dietary intake as well. He intends to continue to lose weight with a goal of at least less than 200 pounds.

## 2013-04-07 NOTE — Assessment & Plan Note (Signed)
Well-controlled blood pressure and her medications.

## 2013-04-07 NOTE — Progress Notes (Signed)
PCP: Lindwood QuaHOFFMAN,BYRON, MD  Clinic Note: Chief Complaint  Patient presents with  . 5 MONTH VISIT    NO CHEST PAIN ,NO SOB ,NO EDEMA    HPI: Chris Wilcox is a 58 y.o. male with a PMH below who presents today for early followup for preoperative risk evaluation for knee surgery. He is a very pleasant gentleman with PMH of ACS in 2008 --> found to have a totally occluded RCA with an unsuccessful attempt for PCI.  There were left to right collaterals.  This is essentially confirmed on stress test in February 2013 which showed a moderate perfusion defect in the basal inferior admitted for a wall with minimal reversibility to be consistent with occluded RCA with collateral flow. He also has chronic/recurrent paroxysmal atrial fibrillation with a CHADS2-VASC2 score of 2.  He is in the toilet on Xarelto and is rate controlled with combination of beta blocker and calcium channel blocker.  He is relatively asymptomatic of his atrial fibrillation. I last saw him in Sept 2014 for pre-op evaluation for Knee Surgery.  Interval History: He presents today for postoperative followup. He has been doing quite well since his surgery. He is very happy that his back able to exercise. He just has his rehabilitation couple weeks ago and has continued doing that on his own. His hip all the soreness and stiffness in the knee with some mild swelling but otherwise is very pleased with the results. From a Cardiac standpoint: He denies any sensation of palpitations or rapid heartbeats.  He denies any chest pain/pressure or shortness of breath with rest or exertion.  No PND, orthopnea or edema.  No melena, hematochezia or hematuria.  No lightheadedness/dizziness, wooziness, 60/near-syncope.  No TIA or amaurosis fugax symptoms.  No nosebleeds.  He is able to do most activities without any symptoms, and no longer limited by his knee pain.    Past Medical History  Diagnosis Date  . Coronary artery disease, occlusive 2008    CATH --  CTO OF RCA - unable to cross for PCI; L-R collateralization.   Marland Kitchen. PAF (paroxysmal atrial fibrillation) 03/13/2011-04/11/2011    ATRIAL FIB wore Event MONITOR;  with CHADS/VASC score of 2 on anticoagulation  . Hypertension     well controlled  . Hyperlipidemia     on statin montiored by PCP  . Obesity   . Pre-diabetes     checks cbg in am  qday  . Arthritis   . Myocardial infarction 2008  . Complication of anesthesia     slow to awaken after last knee arthroscopy    Prior Cardiac Evaluation and Past Surgical History: Past Surgical History  Procedure Laterality Date  . Doppler echocardiography  06/10/2011    EF =>55% -ATRIAL FIB,BORDERLINE BILEAFLET MITRAL VALVE PROLAPSE,MILD TO MOD MITARL INSUFF;MILD AORTIC INSUFF  . Nm myocar perf wall motion  03/13/2011    LEXISCAN--EF 55% LV MILDLY REDUCED;BASAL INFERIOR AND MID INFERIOR SCAR  . Cardiac catheterization  01/20/2007    unsuccessful PCI of RCA  . Index finger surgery Left 2009  . Arthroscopy knee w/ drilling Left yrs ago    x 2  . Total knee arthroplasty Left 12/13/2012    Procedure: LEFT TOTAL KNEE ARTHROPLASTY;  Surgeon: Loanne DrillingFrank V Aluisio, MD;  Location: [facility];  Service: Orthopedics;  Laterality: Left;    Allergies  Allergen Reactions  . Codeine Nausea Only    Current Outpatient Prescriptions  Medication Sig Dispense Refill  . aspirin EC 81 MG tablet Take 81  mg by mouth daily.      Marland Kitchen atorvastatin (LIPITOR) 40 MG tablet Take 40 mg by mouth every evening.       . diltiazem (DILT-XR) 240 MG 24 hr capsule Take 240 mg by mouth every morning.       . ezetimibe (ZETIA) 10 MG tablet Take 1 tablet (10 mg total) by mouth daily.  30 tablet  11  . Glucosamine HCl (GLUCOSAMINE PO) Take by mouth. Take 2  Tablet  day      . HYDROcodone-acetaminophen (NORCO/VICODIN) 5-325 MG per tablet Take 1-2 tablets by mouth every 4 (four) hours as needed for moderate pain.  80 tablet  0  . losartan (COZAAR) 50 MG tablet Take 50 mg by mouth at bedtime.        . methocarbamol (ROBAXIN) 500 MG tablet Take 1 tablet (500 mg total) by mouth every 6 (six) hours as needed for muscle spasms.  80 tablet  0  . metoprolol (LOPRESSOR) 50 MG tablet Take 25 mg by mouth 2 (two) times daily.       . naproxen sodium (ANAPROX) 220 MG tablet Take 220 mg by mouth 4 (four) times daily.      . nitroGLYCERIN (NITROSTAT) 0.4 MG SL tablet Place 0.4 mg under the tongue every 5 (five) minutes as needed for chest pain.      Marland Kitchen omega-3 fish oil (MAXEPA) 1000 MG CAPS capsule Take 3 capsules by mouth daily.      . ondansetron (ZOFRAN) 4 MG tablet Take 1 tablet (4 mg total) by mouth every 6 (six) hours as needed for nausea.  40 tablet  0  . Rivaroxaban (XARELTO) 20 MG TABS tablet Take 1 tablet (20 mg total) by mouth daily with supper.  30 tablet  7  . traMADol (ULTRAM) 50 MG tablet Take 1-2 tablets (50-100 mg total) by mouth every 6 (six) hours as needed (mild pain).  60 tablet  0   No current facility-administered medications for this visit.    History   Social History Narrative   Married father of 2. 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   He is back toexercising at least 15-20 minutes a day 3-4 times a week.  He is able to ride the bicycle for about 10-15 minutes and then needs to stop.   He denies any smoking or drinking history.   ROS: A comprehensive Review of Systems - Endocrine ROS: He is very concerned he had elevated blood sugar levels with borderline diabetes. He is having his stools checked every 3 months. They're also be checking his lipid levels. With this in mind he is very dramatic changes to his diet.  He has reduced his carbohydrate and fat intake in the last couple weeks he has lost 4 pounds.  Musculoskeletal ROS: positive for - residual joint stiffness and joint swelling - left knee  PHYSICAL EXAM BP 128/74  Pulse 80  Ht 6' (1.829 m)  Wt 218 lb 11.2 oz (99.202 kg)  BMI 29.65 kg/m2 Filed Weights    04/07/13 0819  10/21/12   Weight: 218 lb 11.2 oz (99.202 kg)   230 lb.    General appearance: alert, cooperative, appears stated age, no distress, mildly obese and Well-nourished and well-groomed.  Answers questions appropriately. Neck: no adenopathy, no carotid bruit, no JVD and supple, symmetrical, trachea midline Lungs: clear to auscultation bilaterally, normal percussion bilaterally and Nonlabored with good air movement Heart: irregularly irregular rhythm, S1, S2 normal, no S3 or S4 and No murmurs  or rubs Abdomen: soft, non-tender; bowel sounds normal; no masses,  no organomegaly Extremities: extremities normal, atraumatic, no cyanosis or edema, no edema, redness or tenderness in the calves or thighs, no ulcers, gangrene or trophic changes Pulses: 2+ and symmetric Neurologic: Alert and oriented X 3, normal strength and tone. Normal symmetric reflexes. Normal coordination and gait Mental status: Alert, oriented, thought content appropriate HEENT: [place]/AT, EOMI, MMM, anicteric sclera  [id number] today: Yes Rate: 71 , Rhythm: Atrial fibrillation, septal MI, age undetermined.  No significant change.  Recent Labs: November 2013: TC 143, TG 71, HDL 36, LDL 93. --> Last drawn this week and it available..  ASSESSMENT / PLAN: Coronary artery disease, occlusive - Knonw RCA Occlusion He continues to be stable with no active symptoms. He is back exercising without any angina or heart failure symptoms. He is on aspirin, statin, beta blocker and ARB.  AF (paroxysmal atrial fibrillation) Asymptomatic. Rate controlled on beta blocker and calcium channel blocker. Anticoagulated with Xarelto.  HTN (hypertension), benign Well-controlled blood pressure and her medications.  Dyslipidemia, goal LDL below 70 On his recent lipid panel we started him on Zetia. He is noting the cost-effective Zetia. I think he can probably be okay without it depending on his next 2 sets of lipids. He apparently just had lipids checked by his PCP earlier this week with the results  of.  Obesity (BMI 30-39.9) He has lost a significant weight since her last visit and since he's been able to go back to exercising. He is also quite cognizant of his dietary intake as well. He intends to continue to lose weight with a goal of at least less than 200 pounds.    Orders Placed This Encounter  Procedures  . EKG 12-Lead   Meds ordered this encounter  Medications  . omega-3 fish oil (MAXEPA) 1000 MG CAPS capsule    Sig: Take 3 capsules by mouth daily.  . Glucosamine HCl (GLUCOSAMINE PO)    Sig: Take by mouth. Take 2  Tablet  day  . naproxen sodium (ANAPROX) 220 MG tablet    Sig: Take 220 mg by mouth 4 (four) times daily.    Followup: One year  Dominyk Law W. Herbie Baltimore, M.D., M.S. [facility] [address] [place], [place]  [address]  [phone] Pager # [phone]

## 2013-04-18 ENCOUNTER — Other Ambulatory Visit: Payer: Self-pay | Admitting: *Deleted

## 2013-04-18 MED ORDER — DILTIAZEM HCL ER 240 MG PO CP24: 240.0000 mg | ORAL_CAPSULE | Freq: Every morning | ORAL | Status: DC

## 2013-04-18 MED ORDER — ATORVASTATIN CALCIUM 40 MG PO TABS: 40.0000 mg | ORAL_TABLET | Freq: Every evening | ORAL | Status: DC

## 2013-04-18 NOTE — Telephone Encounter (Signed)
Rx was sent to pharmacy electronically. 

## 2013-06-06 ENCOUNTER — Telehealth: Payer: Self-pay | Admitting: Cardiology

## 2013-06-06 NOTE — Telephone Encounter (Signed)
Returned call.  Line busy x 2.  Will try again later.    Will need to contact pt's insurer to find out which urologist is in his network.

## 2013-06-06 NOTE — Telephone Encounter (Signed)
Dr. Herbie BaltimoreHarding - the NP wants to talk with you.  Will you call her at your earliest convenience?  Thanks. ~ Continental Airlinesmber M. Lorin PicketScott, BSN, RN

## 2013-06-06 NOTE — Telephone Encounter (Signed)
I think holding Xarelto for 4-5 days to allow for bleeding to stop & for procedure is OK.  Risk of CVA is not prohibitive.  Marykay Lexavid W Harding, MD

## 2013-06-06 NOTE — Telephone Encounter (Signed)
Seen Chris Wilcox on Friday and told him to hold his Xarelto until today . Would like to know if there is any Urologist that he can recommend .Marland Kitchen.Please call   Thanks

## 2013-06-06 NOTE — Telephone Encounter (Signed)
Will try - busy today. Can't promise.  Chris Lexavid W Nichole Neyer, MD

## 2013-06-06 NOTE — Telephone Encounter (Signed)
Returned call to Chris OlpPeggy Brewer, NP.  Stated pt has hematuria and she has ordered an US and referred him to urology.  Stated she did have pt resume Xarelto, but wanted to know if Dr. Herbie BaltimoreHarding wanted to decrease dose or adjust it in any way.  Stated she understands the statistics, but wasn't sure of the best plan for the patient w/ him having bleeding. Stated his blood counts came back good, which is why she told him to restart Xarelto.  RN reviewed chart and pt is on Xarelto for Afib.  NP stated she is aware of that and wanted to know if pt could hold Xarelto if the urologist wants to do a procedure.  NP informed Dr. Herbie BaltimoreHarding will be notified.  Also informed in most casing where patients have bleeding while on anticoagulants, the MD will have the pt see the specialist to find the cause of the bleeding first and then determine how to proceed.  Informed in pt's case w/ Afib, he is at risk for blood clots/stroke and Dr. Herbie BaltimoreHarding will best advise.  Peggy verbalized understanding and would like to discuss w/ Dr. Herbie BaltimoreHarding.  Informed he will be notified and understands Dr. Herbie BaltimoreHarding will not physically be in the office until Thursday.  Message forwarded to Dr. Herbie BaltimoreHarding to contact Chris OlpPeggy Brewer, NP.

## 2013-06-16 ENCOUNTER — Telehealth: Payer: Self-pay | Admitting: Cardiology

## 2013-06-16 MED ORDER — METOPROLOL TARTRATE 50 MG PO TABS: 25.0000 mg | ORAL_TABLET | Freq: Two times a day (BID) | ORAL | Status: DC

## 2013-06-16 MED ORDER — LOSARTAN POTASSIUM 50 MG PO TABS: 50.0000 mg | ORAL_TABLET | Freq: Every day | ORAL | Status: DC

## 2013-06-16 NOTE — Telephone Encounter (Signed)
Pharmacist been trying to get his Losartan and his Metoprolol. Please call to [facility] in [place].Pt did not know thie phone number.-

## 2013-06-16 NOTE — Telephone Encounter (Signed)
Refills done electronically. 

## 2013-06-24 DIAGNOSIS — N200 Calculus of kidney: Secondary | ICD-10-CM | POA: Insufficient documentation

## 2013-08-18 ENCOUNTER — Telehealth: Payer: Self-pay | Admitting: Cardiology

## 2013-08-18 NOTE — Telephone Encounter (Signed)
Returned patient's call to schedule an appointment with Dr. Herbie BaltimoreHarding.

## 2013-10-17 ENCOUNTER — Encounter: Payer: Self-pay | Admitting: Cardiology

## 2013-10-17 ENCOUNTER — Ambulatory Visit (INDEPENDENT_AMBULATORY_CARE_PROVIDER_SITE_OTHER): Payer: BC Managed Care – PPO | Admitting: Cardiology

## 2013-10-17 VITALS — BP 132/80 | HR 68 | Ht 72.0 in | Wt 222.9 lb

## 2013-10-17 DIAGNOSIS — E669 Obesity, unspecified: Secondary | ICD-10-CM

## 2013-10-17 DIAGNOSIS — Z01810 Encounter for preprocedural cardiovascular examination: Secondary | ICD-10-CM

## 2013-10-17 DIAGNOSIS — I10 Essential (primary) hypertension: Secondary | ICD-10-CM

## 2013-10-17 DIAGNOSIS — E785 Hyperlipidemia, unspecified: Secondary | ICD-10-CM

## 2013-10-17 DIAGNOSIS — I2510 Atherosclerotic heart disease of native coronary artery without angina pectoris: Secondary | ICD-10-CM

## 2013-10-17 DIAGNOSIS — I4891 Unspecified atrial fibrillation: Secondary | ICD-10-CM

## 2013-10-17 DIAGNOSIS — I482 Chronic atrial fibrillation, unspecified: Secondary | ICD-10-CM

## 2013-10-17 MED ORDER — RIVAROXABAN 20 MG PO TABS: 20.0000 mg | ORAL_TABLET | Freq: Every day | ORAL | Status: DC

## 2013-10-17 NOTE — Patient Instructions (Signed)
CARDIAC CLEARANCE  FOR RIGHT FOOT SURGERY- HOLD XARELTO 2 DAYS PRIOR TO SURGERY.   Your physician wants you to follow-up in 6 MONTH DR HARDING.  You will receive a reminder letter in the mail two months in advance. If you don't receive a letter, please call our office to schedule the follow-up appointment.

## 2013-10-17 NOTE — Progress Notes (Signed)
PCP: Chris Qua, MD  Clinic Note: Chief Complaint  Patient presents with  . 6 month visit.    no chest pi , no sob , no edema  . Medical Clearance    right foot surgery 11/9 15 - Dr Chestine Spore    HPI: Chris Wilcox is a 58 y.o. male with a PMH below who presents today for six-month followup of CAD and PAF. He is also seeking preoperative assessment for a low risk for surgery in November -- something about removing the bunion.  He did well during his left knee surgery last year. He is doing very well I saw him back in March. By that time eventually following up in a year, he came in now preoperative assessment..  Past Medical History  Diagnosis Date  . Coronary artery disease, occlusive 2008    CATH -- CTO OF RCA - unable to cross for PCI; L-R collateralization.   Marland Kitchen PAF (paroxysmal atrial fibrillation) 03/13/2011-04/11/2011    ATRIAL FIB wore Event MONITOR;  with CHADS/VASC score of 2 on anticoagulation  . Hypertension     well controlled  . Hyperlipidemia     on statin montiored by PCP  . Obesity   . Pre-diabetes     checks cbg in am  qday  . Arthritis   . Myocardial infarction 2008  . Complication of anesthesia     slow to awaken after last knee arthroscopy    Prior Cardiac Evaluation History: Procedure Laterality Date  . Doppler echocardiography  06/10/2011    EF =>55% -ATRIAL FIB,BORDERLINE BILEAFLET MITRAL VALVE PROLAPSE,MILD TO MOD MITARL INSUFF;MILD AORTIC INSUFF  . Nm myocar perf wall motion  03/13/2011    LEXISCAN--EF 55% LV MILDLY REDUCED;BASAL INFERIOR AND MID INFERIOR SCAR  . Cardiac catheterization  01/20/2007    unsuccessful PCI of RCA   Interval History:  in return today relatively stable with no active symptoms. The intermittently has little swelling in his feet on a day that goes down when he props his feet at night. 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 He stays very active but does not do any  regular exercise except riding his stationary bicycle a couple times a week.  He's had a mild blood in his stools when he wipes and passed a kidney stone in spring associated with fluid hematuria.   Otherwise really from cardiac standpoint, he is doing very well without any major active symptoms of angina with rest or exertion, dyspnea with rest or exertion. No heart failure symptoms of PND, orthopnea with minimal edema. He has atrial fibrillation but doesn't notice any rapid or irregular heartbeats. No syncope/near or TIAs with amaurosis fugax symptoms.   No real melena or hematochezia. No epistaxis. No claudication.  ROS: A comprehensive Review of Systems - was performed  Review of Systems  Constitutional: Negative.   HENT: Positive for congestion. Negative for nosebleeds.   Eyes: Negative for blurred vision and double vision.  Respiratory: Negative for cough, hemoptysis, sputum production, shortness of breath and wheezing.   Cardiovascular: Negative.        Per history of present illness  Gastrointestinal: Positive for blood in stool. Negative for constipation and melena.       With wiping  Genitourinary: Positive for hematuria and flank pain.       Symptoms associated with 1 kidney stone in spring  Musculoskeletal: Positive for joint pain. Negative for falls.  His knee still gets stiff and tired on, but he is able to get about without too much trouble.  Neurological: Negative for tingling, tremors, sensory change, speech change, focal weakness, seizures, loss of consciousness and headaches.  Endo/Heme/Allergies: Negative.  Does not bruise/bleed easily.  Psychiatric/Behavioral: Negative.  Negative for depression. The patient is not nervous/anxious.   All other systems reviewed and are negative.  MEDICATIONS AND ALLERGIES REVIEWED IN EPIC --  no  change SOCIAL AND FAMILY HISTORY REVIEWED IN EPIC --  no  change  Wt Readings from Last 3 Encounters:  10/17/13 222 lb 14.4 oz (101.107  kg)  04/07/13 218 lb 11.2 oz (99.202 kg)  12/14/12 225 lb (102.059 kg)    PHYSICAL EXAM BP 132/80  Pulse 68  Ht 6' (1.829 m)  Wt 222 lb 14.4 oz (101.107 kg)  BMI 30.22 kg/m2 General appearance: alert, cooperative, appears stated age, no distress, mildly obese and Well-nourished and well-groomed. Answers questions appropriately.  HEENT: /AT, EOMI, MMM, anicteric sclera Neck: no adenopathy, no carotid bruit, no JVD and supple, symmetrical, trachea midline  Lungs: clear to auscultation bilaterally, normal percussion bilaterally and Nonlabored with good air movement  Heart: irregularly irregular rhythm, S1, S2 normal, no S3 or S4 and No murmurs or rubs  Abdomen: soft, non-tender; bowel sounds normal; no masses, no organomegaly  Extremities: extremities normal, atraumatic, no cyanosis or edema, no edema, redness or tenderness in the calves or thighs, no ulcers, gangrene or trophic changes  Pulses: 2+ and symmetric  Neurologic: Alert and oriented X 3, normal strength and tone. Normal symmetric reflexes. Normal coordination and gait  Mental status: Alert, oriented, thought content appropriate    Adult ECG Report  Rate: 68 ;  Rhythm: atrial fibrillation with controlled ventricular rate. Cannot exclude septal MI, at that time.   Narrative Interpretation:  stable EKG   Recent Labs:  None available    ASSESSMENT / PLAN: Pre-operative cardiovascular examination Mr. Lorin Wilcox continues to do well without any active cardiac symptoms. He has known coronary disease but is not revascularizable. He has not had any active anginal or heart failure symptoms. His blood pressure is well controlled. He is borderline diabetic but not on insulin. He has normal renal function and no history of stroke.  Based on the preoperative assessment noted below, and the recommendation would be to proceed to the OR without any further cardiac evaluation. If necessary he can hold Xarelto for 48 hours prior to surgery. Would  continue beta blocker and diltiazem perioperatively.  PREOPERATIVE CARDIAC RISK ASSESSMENT   Revised Cardiac Risk Index:  High Risk Surgery: no; low risk  Defined as Intraperitoneal, intrathoracic or suprainguinal vascular  Active CAD: no;   CHF: no;   Cerebrovascular Disease: no;   Diabetes: Borderline; On Insulin: no  CKD (Cr >~ 2): no;   Total: 0 Estimated Risk of Adverse Outcome: LOW RISK for LOW RISK OPERATION. Estimated Risk of MI, PE, VF/VT (Cardiac Arrest), Complete Heart Block: <1 %   ACC/AHA Guidelines for "Clearance":  Step 1 - Need for Emergency Surgery: No:   If Yes - go straight to OR with perioperative surveillance  Step 2 - Active Cardiac Conditions (Unstable Angina, Decompensated HF, Significant  Arrhytmias - Complete HB, Mobitz II, Symptomatic VT or SVT, Severe Aortic Stenosis - mean gradient > 40 mmHg, Valve area < 1.0 cm2):   NO  Step 3 -  Low Risk Surgery: Yes  If Yes --> proceed to OR as  If No --> Step 4  Step 4 - Functional Capacity >= 4 METS without symptoms: Yes  If Yes --> proceed to OR without additional cardiac evaluation  Coronary artery disease, occlusive - RCA Occlusion, previously unable to cross occlusion. Stable, with no active symptoms. He remains on aspirin, statin and beta blocker as well as ARB. As far as I can tell for the last couple years he has not had any anginal symptoms. No heart failure symptoms.  Chronic atrial fibrillation Remains asymptomatic. Again he has been in atrial fibrillation most of the time of seeing him. He is well rate controlled on beta blocker and calcium channel blocker. Anticoagulated with Xarelto.  As indicated above, is relatively healthy 48 hours prior to operation and restarted the next day for the same night depending on safety.  Obesity (BMI 30-39.9) He really well weight loss, weight gain some back because his foot started hurting now. He wants to get the foot operated on to get back into  his weight loss efforts and more frequent exercise.  Dyslipidemia, goal LDL below 70 This is a marker by his PCP. He is on statin plus Zetia and omega-3 fatty acids.  Essential hypertension Well controlled on combination of beta blocker, calcium channel blocker and ARB each moderate doses.    Orders Placed This Encounter  Procedures  . EKG 12-Lead   Meds ordered this encounter  Medications  . rivaroxaban (XARELTO) 20 MG TABS tablet    Sig: Take 1 tablet (20 mg total) by mouth daily with supper.    Dispense:  30 tablet    Refill:  7   Followup: 6 months per patient request    Marykay Lex, M.D., M.S. Interventional Cardiologist   Pager # [phone]

## 2013-10-19 ENCOUNTER — Encounter: Payer: Self-pay | Admitting: Cardiology

## 2013-10-19 NOTE — Assessment & Plan Note (Signed)
Well controlled on combination of beta blocker, calcium channel blocker and ARB each moderate doses.

## 2013-10-19 NOTE — Assessment & Plan Note (Signed)
He really well weight loss, weight gain some back because his foot started hurting now. He wants to get the foot operated on to get back into his weight loss efforts and more frequent exercise.

## 2013-10-19 NOTE — Assessment & Plan Note (Signed)
This is a marker by his PCP. He is on statin plus Zetia and omega-3 fatty acids.

## 2013-10-19 NOTE — Assessment & Plan Note (Addendum)
Mr. Chris Wilcox continues to do well without any active cardiac symptoms. He has known coronary disease but is not revascularizable. He has not had any active anginal or heart failure symptoms. His blood pressure is well controlled. He is borderline diabetic but not on insulin. He has normal renal function and no history of stroke.  Based on the preoperative assessment noted below, and the recommendation would be to proceed to the OR without any further cardiac evaluation. If necessary he can hold Xarelto for 48 hours prior to surgery. Would continue beta blocker and diltiazem perioperatively.  PREOPERATIVE CARDIAC RISK ASSESSMENT   Revised Cardiac Risk Index:  High Risk Surgery: no; low risk  Defined as Intraperitoneal, intrathoracic or suprainguinal vascular  Active CAD: no;   CHF: no;   Cerebrovascular Disease: no;   Diabetes: Borderline; On Insulin: no  CKD (Cr >~ 2): no;   Total: 0 Estimated Risk of Adverse Outcome: LOW RISK for LOW RISK OPERATION. Estimated Risk of MI, PE, VF/VT (Cardiac Arrest), Complete Heart Block: <1 %   ACC/AHA Guidelines for "Clearance":  Step 1 - Need for Emergency Surgery: No:   If Yes - go straight to OR with perioperative surveillance  Step 2 - Active Cardiac Conditions (Unstable Angina, Decompensated HF, Significant  Arrhytmias - Complete HB, Mobitz II, Symptomatic VT or SVT, Severe Aortic Stenosis - mean gradient > 40 mmHg, Valve area < 1.0 cm2):   NO  Step 3 -  Low Risk Surgery: Yes  If Yes --> proceed to OR as  If No --> Step 4  Step 4 - Functional Capacity >= 4 METS without symptoms: Yes  If Yes --> proceed to OR without additional cardiac evaluation

## 2013-10-19 NOTE — Assessment & Plan Note (Signed)
Stable, with no active symptoms. He remains on aspirin, statin and beta blocker as well as ARB. As far as I can tell for the last couple years he has not had any anginal symptoms. No heart failure symptoms.

## 2013-10-19 NOTE — Assessment & Plan Note (Signed)
Remains asymptomatic. Again he has been in atrial fibrillation most of the time of seeing him. He is well rate controlled on beta blocker and calcium channel blocker. Anticoagulated with Xarelto.  As indicated above, is relatively healthy 48 hours prior to operation and restarted the next day for the same night depending on safety.

## 2013-10-27 ENCOUNTER — Telehealth: Payer: Self-pay | Admitting: *Deleted

## 2013-10-27 NOTE — Telephone Encounter (Signed)
Faxed clearance for right foot surgery -5 days aspirin and 2 days xarelto prior to surgery Should restarted per surgery Patient aware

## 2013-12-12 ENCOUNTER — Telehealth: Payer: Self-pay | Admitting: Cardiology

## 2013-12-12 NOTE — Telephone Encounter (Signed)
She is checking on the fax that was faxed over on 11-21-13.Please fax this back asap,if you do not have it call and let her know.

## 2014-01-01 ENCOUNTER — Other Ambulatory Visit: Payer: Self-pay | Admitting: Cardiology

## 2014-01-02 ENCOUNTER — Other Ambulatory Visit: Payer: Self-pay | Admitting: Cardiology

## 2014-01-02 NOTE — Telephone Encounter (Signed)
Rx was sent to pharmacy electronically. 

## 2014-01-02 NOTE — Telephone Encounter (Signed)
Rx has been sent to the pharmacy electronically. ° °

## 2014-02-26 ENCOUNTER — Other Ambulatory Visit: Payer: Self-pay | Admitting: Cardiology

## 2014-02-26 NOTE — Telephone Encounter (Signed)
Rx(s) sent to pharmacy electronically.  

## 2014-03-14 ENCOUNTER — Other Ambulatory Visit: Payer: Self-pay | Admitting: Cardiology

## 2014-03-14 NOTE — Telephone Encounter (Signed)
Rx(s) sent to pharmacy electronically.  

## 2014-05-15 ENCOUNTER — Encounter: Payer: Self-pay | Admitting: Cardiology

## 2014-05-15 ENCOUNTER — Other Ambulatory Visit: Payer: Self-pay | Admitting: *Deleted

## 2014-05-15 ENCOUNTER — Ambulatory Visit (INDEPENDENT_AMBULATORY_CARE_PROVIDER_SITE_OTHER): Payer: BLUE CROSS/BLUE SHIELD | Admitting: Cardiology

## 2014-05-15 VITALS — BP 128/70 | HR 72 | Ht 72.0 in | Wt 229.6 lb

## 2014-05-15 DIAGNOSIS — E785 Hyperlipidemia, unspecified: Secondary | ICD-10-CM | POA: Diagnosis not present

## 2014-05-15 DIAGNOSIS — I482 Chronic atrial fibrillation, unspecified: Secondary | ICD-10-CM

## 2014-05-15 DIAGNOSIS — I10 Essential (primary) hypertension: Secondary | ICD-10-CM | POA: Diagnosis not present

## 2014-05-15 DIAGNOSIS — I2510 Atherosclerotic heart disease of native coronary artery without angina pectoris: Secondary | ICD-10-CM | POA: Diagnosis not present

## 2014-05-15 DIAGNOSIS — E669 Obesity, unspecified: Secondary | ICD-10-CM

## 2014-05-15 NOTE — Progress Notes (Signed)
PCP: Lindwood QuaHOFFMAN,BYRON, MD  Clinic Note: Chief Complaint  Patient presents with  . ROV 6 months    patient reports no complaints.  . Coronary Artery Disease  . Atrial Fibrillation    HPI: Chris Wilcox is a 59 y.o. male with a PMH below who presents today for 6 month f/u of CAD-PCI & PAF.Marland Kitchen. Nov 2015 - foot Sgx for broken bone  Past Medical History  Diagnosis Date  . Coronary artery disease, occlusive 2008    CATH -- CTO OF RCA - unable to cross for PCI; L-R collateralization.   Marland Kitchen. PAF (paroxysmal atrial fibrillation) 03/13/2011-04/11/2011    ATRIAL FIB wore Event MONITOR;  with CHADS/VASC score of 2 on anticoagulation  . Hypertension     well controlled  . Hyperlipidemia     on statin montiored by PCP  . Obesity   . Pre-diabetes     checks cbg in am  qday  . Arthritis   . Myocardial infarction 2008  . Complication of anesthesia     slow to awaken after last knee arthroscopy    Prior Cardiac Evaluation and Past Surgical History: Past Surgical History  Procedure Laterality Date  . Doppler echocardiography  06/10/2011    EF =>55% -ATRIAL FIB,BORDERLINE BILEAFLET MITRAL VALVE PROLAPSE,MILD TO MOD MITARL INSUFF;MILD AORTIC INSUFF  . Nm myocar perf wall motion  03/13/2011    LEXISCAN--EF 55% LV MILDLY REDUCED;BASAL INFERIOR AND MID INFERIOR SCAR  . Cardiac catheterization  01/20/2007    unsuccessful PCI of RCA  . Index finger surgery Left 2009  . Arthroscopy knee w/ drilling Left yrs ago    x 2  . 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 No heart failure symptoms of PND orthopnea or edema. He has mild edema in the day.. Continue to be relatively active on but has gained weight because of his foot pain and prolonged recovery from surgery.  Foot surgery last November - long term on crutches  & scooter.  Finally back active. Colonoscopy - Jan - 3 polyps (1 pre-Ca) --> plan re-look July.  Cardiovascular ROS: no chest pain or dyspnea on exertion positive for - mild ankle edem a@ end of day negative for - chest pain, dyspnea on exertion, irregular heartbeat, loss of consciousness, murmur, orthopnea, palpitations, paroxysmal nocturnal dyspnea, rapid heart rate, shortness of breath or TIA/Amaurosis fugax   No melena, hematochezia, hematuria, or epstaxis. No claudication.   ROS: A comprehensive was performed. Review of Systems  HENT: Negative for nosebleeds.   Respiratory: Negative for cough, shortness of breath and wheezing.   Cardiovascular: Negative.  Negative for claudication.       Per HPI  Gastrointestinal: Negative for blood in stool and melena.  Genitourinary: Negative for hematuria.  Musculoskeletal: Positive for myalgias and joint pain (foot still a bit sore, but much better.).  Neurological: Negative for loss of consciousness.  Endo/Heme/Allergies: Does not bruise/bleed easily.  Psychiatric/Behavioral: Negative for depression.  All other systems reviewed and are negative.   Current Outpatient Prescriptions on File Prior to Visit  Medication Sig Dispense Refill  . aspirin EC 81 MG tablet Take 81 mg by mouth daily.    Marland Kitchen. atorvastatin (LIPITOR) 40 MG tablet TAKE 1 TABLET BY MOUTH EVERY EVENING. 90 tablet 2  . diltiazem (CARDIZEM CD) 240 MG  24 hr capsule TAKE ONE CAPSULE BY MOUTH EVERY MORNING 90 capsule 2  . Glucosamine HCl (GLUCOSAMINE PO) Take 2 tablets by mouth daily.     Marland Kitchen losartan (COZAAR) 50 MG tablet TAKE 1 TABLET BY MOUTH AT BEDTIME 30 tablet 9  . methocarbamol (ROBAXIN) 500 MG tablet Take 1 tablet (500 mg total) by mouth every 6 (six) hours as needed for muscle spasms. 80 tablet 0  . metoprolol (LOPRESSOR) 50 MG tablet TAKE 1/2 TABLET BY MOUTH TWICE A DAY 39 tablet 8  . naproxen sodium (ANAPROX) 220 MG tablet Take 220 mg by mouth as needed (pain).     .  nitroGLYCERIN (NITROSTAT) 0.4 MG SL tablet Place 0.4 mg under the tongue every 5 (five) minutes as needed for chest pain.    Marland Kitchen omega-3 fish oil (MAXEPA) 1000 MG CAPS capsule Take 3 capsules by mouth daily.    . rivaroxaban (XARELTO) 20 MG TABS tablet Take 1 tablet (20 mg total) by mouth daily with supper. 30 tablet 7  . traMADol (ULTRAM) 50 MG tablet Take 1-2 tablets (50-100 mg total) by mouth every 6 (six) hours as needed (mild pain). 60 tablet 0  . ZETIA 10 MG tablet TAKE ONE TABLET BY MOUTH DAILY 30 tablet 11   No current facility-administered medications on file prior to visit.   Allergies  Allergen Reactions  . Codeine Nausea And Vomiting    GI upset    History  Substance Use Topics  . Smoking status: Never Smoker   . Smokeless tobacco: Never Used  . Alcohol Use: No   Family History  Problem Relation Age of Onset  . Diabetes Mother   . Diabetes Father   . Diabetes Maternal Grandmother   . Stroke Maternal Grandfather   . Cancer Paternal Grandmother     Wt Readings from Last 3 Encounters:  05/15/14 229 lb 9.6 oz (104.146 kg)  10/17/13 222 lb 14.4 oz (101.107 kg)  04/07/13 218 lb 11.2 oz (99.202 kg)    PHYSICAL EXAM BP 128/70 mmHg  Pulse 72  Ht 6' (1.829 m)  Wt 229 lb 9.6 oz (104.146 kg)  BMI 31.13 kg/m2 General appearance: alert, cooperative, appears stated age, no distress, mildly obese and Well-nourished and well-groomed. Answers questions appropriately.  HEENT: [place]/AT, EOMI, MMM, anicteric sclera Neck: no adenopathy, no carotid bruit, no JVD and supple, symmetrical, trachea midline  Lungs: clear to auscultation bilaterally, normal percussion bilaterally and Nonlabored with good air movement  Heart: irregularly irregular rhythm - normal rate, S1, S2 normal, no S3 or S4 and No murmurs or rubs  Abdomen: soft, non-tender; bowel sounds normal; no masses, no organomegaly  Extremities: extremities normal, atraumatic, no cyanosis; mild R>L ~1+ LEE; NO, redness or  tenderness in the calves or thighs, no ulcers, gangrene or trophic changes  Pulses: 2+ and symmetric  Neurologic: Alert and oriented X 3, normal strength and tone. Normal symmetric reflexes. Normal coordination and gait  Mental status: Alert, oriented, thought content appropriate    Adult ECG Report  Rate: 72 ;  Rhythm: atrial fibrillation; non-specific ST-T abnormalities.  Narrative Interpretation: stable EKG  Recent Labs:  On Care-Everywhere; checked 10 d ago.   ASSESSMENT / PLAN: Problem List Items Addressed This Visit    Chronic atrial fibrillation (Chronic)    He basically seemed to be oblivious to him being in atrial fibrillation. No sensation of rapid heart beats. Rate is well controlled with beta blocker/calcium, accommodation. Anticoagulated on Xarelto without any bleeding issues. He should be  clinically safe to stop Xarelto for procedures: 48 hours for intermediate high-risk and 24-hour for low risk procedures.      Relevant Orders   EKG 12-Lead (Completed)   Coronary artery disease, occlusive - RCA Occlusion, previously unable to cross occlusion. - Primary (Chronic)    He continues to be stable any active angina or other symptoms. Continue current medications including aspirin, statin, beta blocker and ARB. Also lifestyle adjustment would be tissue he continues to exercise enough to determine if he is having symptoms.      Relevant Orders   EKG 12-Lead (Completed)   Dyslipidemia, goal LDL below 70 (Chronic)    Lab data is followed by PCP. I don't have his recent labs. He is on statin plus Zetia and omega-3 fatty acids.      Relevant Orders   EKG 12-Lead (Completed)   Essential hypertension (Chronic)    Blood pressure well-controlled on current medications. No change.      Relevant Orders   EKG 12-Lead (Completed)   Obesity (BMI 30-39.9) (Chronic)    He had been doing well, but gained back weight after his surgery his sideline him for a short period time. Hopefully  once he is recovering from surgery she'll be able to bend the MRI he would resume routine exercise program.         Orders Placed This Encounter  Procedures  . EKG 12-Lead   No orders of the defined types were placed in this encounter.     Followup: 6 months    HARDING, Piedad Climes, M.D., M.S. Interventional Cardiologist   Pager # [phone]

## 2014-05-15 NOTE — Patient Instructions (Signed)
NO CHANGE WITH CURRENT MEDICATIONS  Your physician wants you to follow-up in 6 MONTH DR HARDING. You will receive a reminder letter in the mail two months in advance. If you don't receive a letter, please call our office to schedule the follow-up appointment.

## 2014-05-17 ENCOUNTER — Encounter: Payer: Self-pay | Admitting: Cardiology

## 2014-05-17 NOTE — Assessment & Plan Note (Signed)
Blood pressure well controlled on current medications. No change 

## 2014-05-17 NOTE — Assessment & Plan Note (Signed)
He had been doing well, but gained back weight after his surgery his sideline him for a short period time. Hopefully once he is recovering from surgery she'll be able to bend the MRI he would resume routine exercise program.

## 2014-05-17 NOTE — Assessment & Plan Note (Signed)
He basically seemed to be oblivious to him being in atrial fibrillation. No sensation of rapid heart beats. Rate is well controlled with beta blocker/calcium, accommodation. Anticoagulated on Xarelto without any bleeding issues. He should be clinically safe to stop Xarelto for procedures: 48 hours for intermediate high-risk and 24-hour for low risk procedures.

## 2014-05-17 NOTE — Assessment & Plan Note (Signed)
He continues to be stable any active angina or other symptoms. Continue current medications including aspirin, statin, beta blocker and ARB. Also lifestyle adjustment would be tissue he continues to exercise enough to determine if he is having symptoms.

## 2014-05-17 NOTE — Assessment & Plan Note (Signed)
Lab data is followed by PCP. I don't have his recent labs. He is on statin plus Zetia and omega-3 fatty acids.

## 2014-07-21 ENCOUNTER — Other Ambulatory Visit: Payer: Self-pay | Admitting: Cardiology

## 2014-07-21 NOTE — Telephone Encounter (Signed)
Rx(s) sent to pharmacy electronically.  

## 2014-08-27 ENCOUNTER — Other Ambulatory Visit: Payer: Self-pay | Admitting: Cardiology

## 2014-11-01 DIAGNOSIS — Z96652 Presence of left artificial knee joint: Secondary | ICD-10-CM | POA: Insufficient documentation

## 2014-11-12 ENCOUNTER — Other Ambulatory Visit: Payer: Self-pay | Admitting: Cardiology

## 2014-11-13 NOTE — Telephone Encounter (Signed)
REFILL 

## 2014-12-07 ENCOUNTER — Other Ambulatory Visit: Payer: Self-pay | Admitting: Cardiology

## 2014-12-25 ENCOUNTER — Other Ambulatory Visit: Payer: Self-pay | Admitting: Cardiology

## 2015-01-23 ENCOUNTER — Telehealth: Payer: Self-pay

## 2015-01-23 NOTE — Telephone Encounter (Signed)
Prior auth for Ezitimibe 10mg  sent to [employer].

## 2015-01-25 DIAGNOSIS — E1122 Type 2 diabetes mellitus with diabetic chronic kidney disease: Secondary | ICD-10-CM | POA: Insufficient documentation

## 2015-01-28 ENCOUNTER — Other Ambulatory Visit: Payer: Self-pay | Admitting: Cardiology

## 2015-02-06 ENCOUNTER — Telehealth: Payer: Self-pay

## 2015-02-06 NOTE — Telephone Encounter (Signed)
Ezitimibe denied by [employer].

## 2015-02-08 ENCOUNTER — Ambulatory Visit (INDEPENDENT_AMBULATORY_CARE_PROVIDER_SITE_OTHER): Payer: BLUE CROSS/BLUE SHIELD | Admitting: Cardiology

## 2015-02-08 VITALS — BP 138/92 | HR 86 | Ht 72.0 in | Wt 232.9 lb

## 2015-02-08 DIAGNOSIS — I10 Essential (primary) hypertension: Secondary | ICD-10-CM | POA: Diagnosis not present

## 2015-02-08 DIAGNOSIS — I482 Chronic atrial fibrillation, unspecified: Secondary | ICD-10-CM

## 2015-02-08 DIAGNOSIS — I2510 Atherosclerotic heart disease of native coronary artery without angina pectoris: Secondary | ICD-10-CM

## 2015-02-08 DIAGNOSIS — E669 Obesity, unspecified: Secondary | ICD-10-CM

## 2015-02-08 DIAGNOSIS — E785 Hyperlipidemia, unspecified: Secondary | ICD-10-CM | POA: Diagnosis not present

## 2015-02-08 NOTE — Progress Notes (Signed)
PCP: Lindwood Qua, MD  Clinic Note: Chief Complaint  Patient presents with  . Follow-up    6 month//CAD, RCA occlusion//pt states no Sx. or concerns.  . Atrial Fibrillation    Chronic/Persistent  . Coronary Artery Disease    HPI: Chris Wilcox is a 60 y.o. male with a PMH below who presents today for ~8-9 month f/u for CAD & Afib (Chronic). Known RCA occlusion, but no active anginal  Archer Moist was last seen in April 2016.  Doing well.  Was recovering from Foot Sgx.    Recent Hospitalizations: none  Studies Reviewed: none  Interval History: Diago presents today really doing well without any major complaints from cardiac standpoint. He has no sensation of being in atrial fibrillation. No active rapid or irregular heartbeat sensations. No anginal symptoms of chest pain/dyspnea with rest or exertion. Mild swelling in the foot that was broken, but otherwise no edema. No PND, orthopnea. No palpitations, lightheadedness, dizziness, weakness or syncope/near syncope. No TIA/amaurosis fugax symptoms. No melena, hematochezia, hematuria, or epstaxis. No claudication. He was recently diagnosed as having diabetes and was started on glipizide about 6 weeks ago.   ROS: A comprehensive was performed. Review of Systems  Constitutional: Negative for malaise/fatigue.  Respiratory: Negative for cough and shortness of breath.   Cardiovascular:       Per history of present illness  Musculoskeletal: Positive for joint pain.       The operated foot still hurts if he does much activity  Neurological: Negative for dizziness.  Endo/Heme/Allergies: Does not bruise/bleed easily.  Psychiatric/Behavioral: Negative.   All other systems reviewed and are negative.   Past Medical History  Diagnosis Date  . Coronary artery disease, occlusive 2008    CATH -- CTO OF RCA - unable to cross for PCI; L-R collateralization.   Marland Kitchen PAF (paroxysmal atrial fibrillation) (HCC) 03/13/2011-04/11/2011    ATRIAL FIB wore Event  MONITOR;  with CHADS/VASC score of 2 on anticoagulation  . Hypertension     well controlled  . Hyperlipidemia     on statin montiored by PCP  . Obesity   . Pre-diabetes     checks cbg in am  qday  . Arthritis   . Myocardial infarction (HCC) 2008  . Complication of anesthesia     slow to awaken after last knee arthroscopy    Past Surgical History  Procedure Laterality Date  . Doppler echocardiography  06/10/2011    EF =>55% -ATRIAL FIB,BORDERLINE BILEAFLET MITRAL VALVE PROLAPSE,MILD TO MOD MITARL INSUFF;MILD AORTIC INSUFF  . Nm myocar perf wall motion  03/13/2011    LEXISCAN--EF 55% LV MILDLY REDUCED;BASAL INFERIOR AND MID INFERIOR SCAR  . Cardiac catheterization  01/20/2007    unsuccessful PCI of RCA  . Index finger surgery Left 2009  . Arthroscopy knee w/ drilling Left yrs ago    x 2  . Total knee arthroplasty Left 12/13/2012    Procedure: LEFT TOTAL KNEE ARTHROPLASTY;  Surgeon: Loanne Drilling, MD;  Location: [facility];  Service: Orthopedics;  Laterality: Left;   Prior to Admission medications   Medication Sig Start Date End Date Taking? Authorizing Provider  Acetaminophen (TYLENOL EXTRA STRENGTH PO) Take 1 tablet by mouth as needed (takes 1-2 as needed, usually in the afternoon.).   Yes Historical Provider, MD  aspirin EC 81 MG tablet Take 81 mg by mouth daily.   Yes Historical Provider, MD  atorvastatin (LIPITOR) 40 MG tablet TAKE 1 TABLET BY MOUTH EVERY EVENING. 08/28/14  Yes Piedad Climes  Herbie Baltimore, MD  diltiazem (CARDIZEM CD) 240 MG 24 hr capsule TAKE ONE CAPSULE BY MOUTH EVERY MORNING 12/07/14  Yes Marykay Lex, MD  GLIPIZIDE PO Take 1 tablet by mouth daily.   Yes Historical Provider, MD  losartan (COZAAR) 50 MG tablet TAKE 1 TABLET BY MOUTH AT BEDTIME 11/13/14  Yes Marykay Lex, MD  methocarbamol (ROBAXIN) 500 MG tablet Take 1 tablet (500 mg total) by mouth every 6 (six) hours as needed for muscle spasms. 12/16/12  Yes Avel Peace, PA-C  metoprolol (LOPRESSOR) 50 MG tablet  TAKE 1/2 TABLET BY MOUTH TWICE A DAY 01/30/15  Yes Marykay Lex, MD  nitroGLYCERIN (NITROSTAT) 0.4 MG SL tablet Place 0.4 mg under the tongue every 5 (five) minutes as needed for chest pain.   Yes Historical Provider, MD  omega-3 fish oil (MAXEPA) 1000 MG CAPS capsule Take 3 capsules by mouth daily.   Yes Historical Provider, MD  traMADol (ULTRAM) 50 MG tablet Take 1-2 tablets (50-100 mg total) by mouth every 6 (six) hours as needed (mild pain). 12/16/12  Yes Avel Peace, PA-C  XARELTO 20 MG TABS tablet TAKE 1 TABLET BY MOUTH DAILY WITH SUPPER. 07/21/14  Yes Marykay Lex, MD  ZETIA 10 MG tablet TAKE ONE TABLET BY MOUTH DAILY 12/26/14  Yes Marykay Lex, MD   Allergies  Allergen Reactions  . Codeine Nausea And Vomiting    GI upset  . Hydrocodone Nausea Only    Social History   Social History  . Marital Status: Married    Spouse Name: N/A  . Number of Children: N/A  . Years of Education: N/A   Social History Main Topics  . Smoking status: Never Smoker   . Smokeless tobacco: Never Used  . Alcohol Use: No  . Drug Use: No  . Sexual Activity: Not Asked   Other Topics Concern  . 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   He is back toexercising at least 15-20 minutes a day 3-4 times a week.  He is able to ride the bicycle for about 10-15 minutes and then needs to stop.   He denies any smoking or drinking history.   Family History  Problem Relation Age of Onset  . Diabetes Mother   . Diabetes Father   . Diabetes Maternal Grandmother   . Stroke Maternal Grandfather   . Cancer Paternal Grandmother      Wt Readings from Last 3 Encounters:  02/08/15 232 lb 14.4 oz (105.643 kg)  05/15/14 229 lb 9.6 oz (104.146 kg)  10/17/13 222 lb 14.4 oz (101.107 kg)    PHYSICAL EXAM BP 138/92 mmHg  Pulse 86  Ht 6' (1.829 m)  Wt 232 lb 14.4 oz (105.643 kg)  BMI 31.58 kg/m2 General appearance: alert, cooperative, appears stated age, no  distress, mildly obese and Well-nourished and well-groomed. Answers questions appropriately.  HEENT: [place]/AT, EOMI, MMM, anicteric sclera Neck: no adenopathy, no carotid bruit, no JVD and supple, symmetrical, trachea midline  Lungs: clear to auscultation bilaterally, normal percussion bilaterally and Nonlabored with good air movement  Heart: irregularly irregular rhythm - normal rate, S1, S2 normal, no S3 or S4 and No murmurs or rubs  Abdomen: soft, non-tender; bowel sounds normal; no masses, no organomegaly  Extremities: extremities normal, atraumatic, no cyanosis; mild R>L ~1+ LEE; NO, redness or tenderness in the calves or thighs, no ulcers, gangrene or trophic changes  Pulses: 2+ and symmetric  Neurologic:  Alert and oriented X 3, normal strength and tone. Normal symmetric reflexes. Normal coordination and gait  Mental status: Alert, oriented, thought content appropriate     Adult ECG Report  Rate:  86 ;  Rhythm: atrial fibrillation and artifact related ST-T wave abnormalities.;   Narrative Interpretation:  Stable EKG.  Other studies Reviewed: Additional studies/ records that were reviewed today include:  Recent Labs:  Just had blood work by PCP.  ASSESSMENT / PLAN: Problem List Items Addressed This Visit    Obesity (BMI 30-39.9) (Chronic)    He is actually gained weight in the last year and a half. Probably because of his reduced activity after foot surgery.  The patient understands the need to lose weight with diet and exercise. We have discussed specific strategies for this.      Relevant Medications   GLIPIZIDE PO   Essential hypertension (Chronic)    Borderline diastolic pressure today. Otherwise his blood pressures are always been well controlled. Continue to monitor. He does have room to titrate up some medications, but apparently is not taking his metoprolol for a few days. Would just monitor.      Relevant Orders   EKG 12-Lead   Dyslipidemia, goal LDL below 70  (Chronic)    He just had blood work checked by his PCP. We'll try to get the results. Remains on stable dose of statin with no myalgias. Also on Zetia and omega-3 fatty acids for additional control.      Relevant Orders   EKG 12-Lead   Coronary artery disease, occlusive - RCA Occlusion, previously unable to cross occlusion. (Chronic)    No active anginal symptoms. Stable with beta blocker and calcium channel blocker. On statin plus Zetia.  With him being on Xarelto, I think we can switch his aspirin to every other day. Continue to be as active as possible.      Relevant Orders   EKG 12-Lead   Chronic atrial fibrillation (HCC) - Primary (Chronic)    He continues to be oblivious to his A. fib. No sensation of rapid irregular heartbeats. This patients CHA2DS2-VASc Score and unadjusted Ischemic Stroke Rate (% per year) is equal to 2.2 % stroke rate/year from a score of 2 -- he is stable on Xarelto with no bleeding issues.        Relevant Orders   EKG 12-Lead      Current medicines are reviewed at length with the patient today. (+/- concerns) none The following changes have been made: none   ROV 6-8 months  Studies Ordered:   Orders Placed This Encounter  Procedures  . EKG 12-Lead      Marykay LexHARDING, DAVID W, M.D., M.S. Interventional Cardiologist   Pager # [phone]

## 2015-02-08 NOTE — Patient Instructions (Signed)
NO CHANGE WITH CURRENT MEDICATIONS   Your physician wants you to follow-up in 5-8 MONTHS DR HARDING.  You will receive a reminder letter in the mail two months in advance. If you don't receive a letter, please call our office to schedule the follow-up appointment.  If you need a refill on your cardiac medications before your next appointment, please call your pharmacy.

## 2015-02-10 ENCOUNTER — Encounter: Payer: Self-pay | Admitting: Cardiology

## 2015-02-10 NOTE — Assessment & Plan Note (Signed)
He is actually gained weight in the last year and a half. Probably because of his reduced activity after foot surgery.  The patient understands the need to lose weight with diet and exercise. We have discussed specific strategies for this.

## 2015-02-10 NOTE — Assessment & Plan Note (Signed)
He just had blood work checked by his PCP. We'll try to get the results. Remains on stable dose of statin with no myalgias. Also on Zetia and omega-3 fatty acids for additional control.

## 2015-02-10 NOTE — Assessment & Plan Note (Addendum)
He continues to be oblivious to his A. fib. No sensation of rapid irregular heartbeats. This patients CHA2DS2-VASc Score and unadjusted Ischemic Stroke Rate (% per year) is equal to 2.2 % stroke rate/year from a score of 2 -- he is stable on Xarelto with no bleeding issues.

## 2015-02-10 NOTE — Assessment & Plan Note (Signed)
No active anginal symptoms. Stable with beta blocker and calcium channel blocker. On statin plus Zetia.  With him being on Xarelto, I think we can switch his aspirin to every other day. Continue to be as active as possible.

## 2015-02-10 NOTE — Assessment & Plan Note (Signed)
Borderline diastolic pressure today. Otherwise his blood pressures are always been well controlled. Continue to monitor. He does have room to titrate up some medications, but apparently is not taking his metoprolol for a few days. Would just monitor.

## 2015-02-28 ENCOUNTER — Other Ambulatory Visit: Payer: Self-pay | Admitting: Cardiology

## 2015-02-28 NOTE — Telephone Encounter (Signed)
E SENT TO PHARMACY 

## 2015-03-17 ENCOUNTER — Other Ambulatory Visit: Payer: Self-pay | Admitting: Cardiology

## 2015-04-06 ENCOUNTER — Other Ambulatory Visit: Payer: Self-pay | Admitting: Cardiology

## 2015-05-13 ENCOUNTER — Other Ambulatory Visit: Payer: Self-pay | Admitting: Cardiology

## 2015-05-21 ENCOUNTER — Other Ambulatory Visit: Payer: Self-pay | Admitting: Cardiology

## 2015-05-21 NOTE — Telephone Encounter (Signed)
Rx(s) sent to pharmacy electronically.  

## 2015-06-03 ENCOUNTER — Other Ambulatory Visit: Payer: Self-pay | Admitting: Cardiology

## 2015-06-05 NOTE — Telephone Encounter (Signed)
REFILL 

## 2015-06-18 ENCOUNTER — Other Ambulatory Visit: Payer: Self-pay | Admitting: Cardiology

## 2015-06-18 NOTE — Telephone Encounter (Signed)
Rx request sent to pharmacy.  

## 2015-06-20 IMAGING — CR DG CHEST 2V
2 series · 2 of 2 positions shown · non-contrast
Comparison: 01/19/2007

CLINICAL DATA: Left knee osteoarthritis. Hypertension. Pre-op
respiratory exam.

EXAM:
CHEST  2 VIEW

[w chest pa]
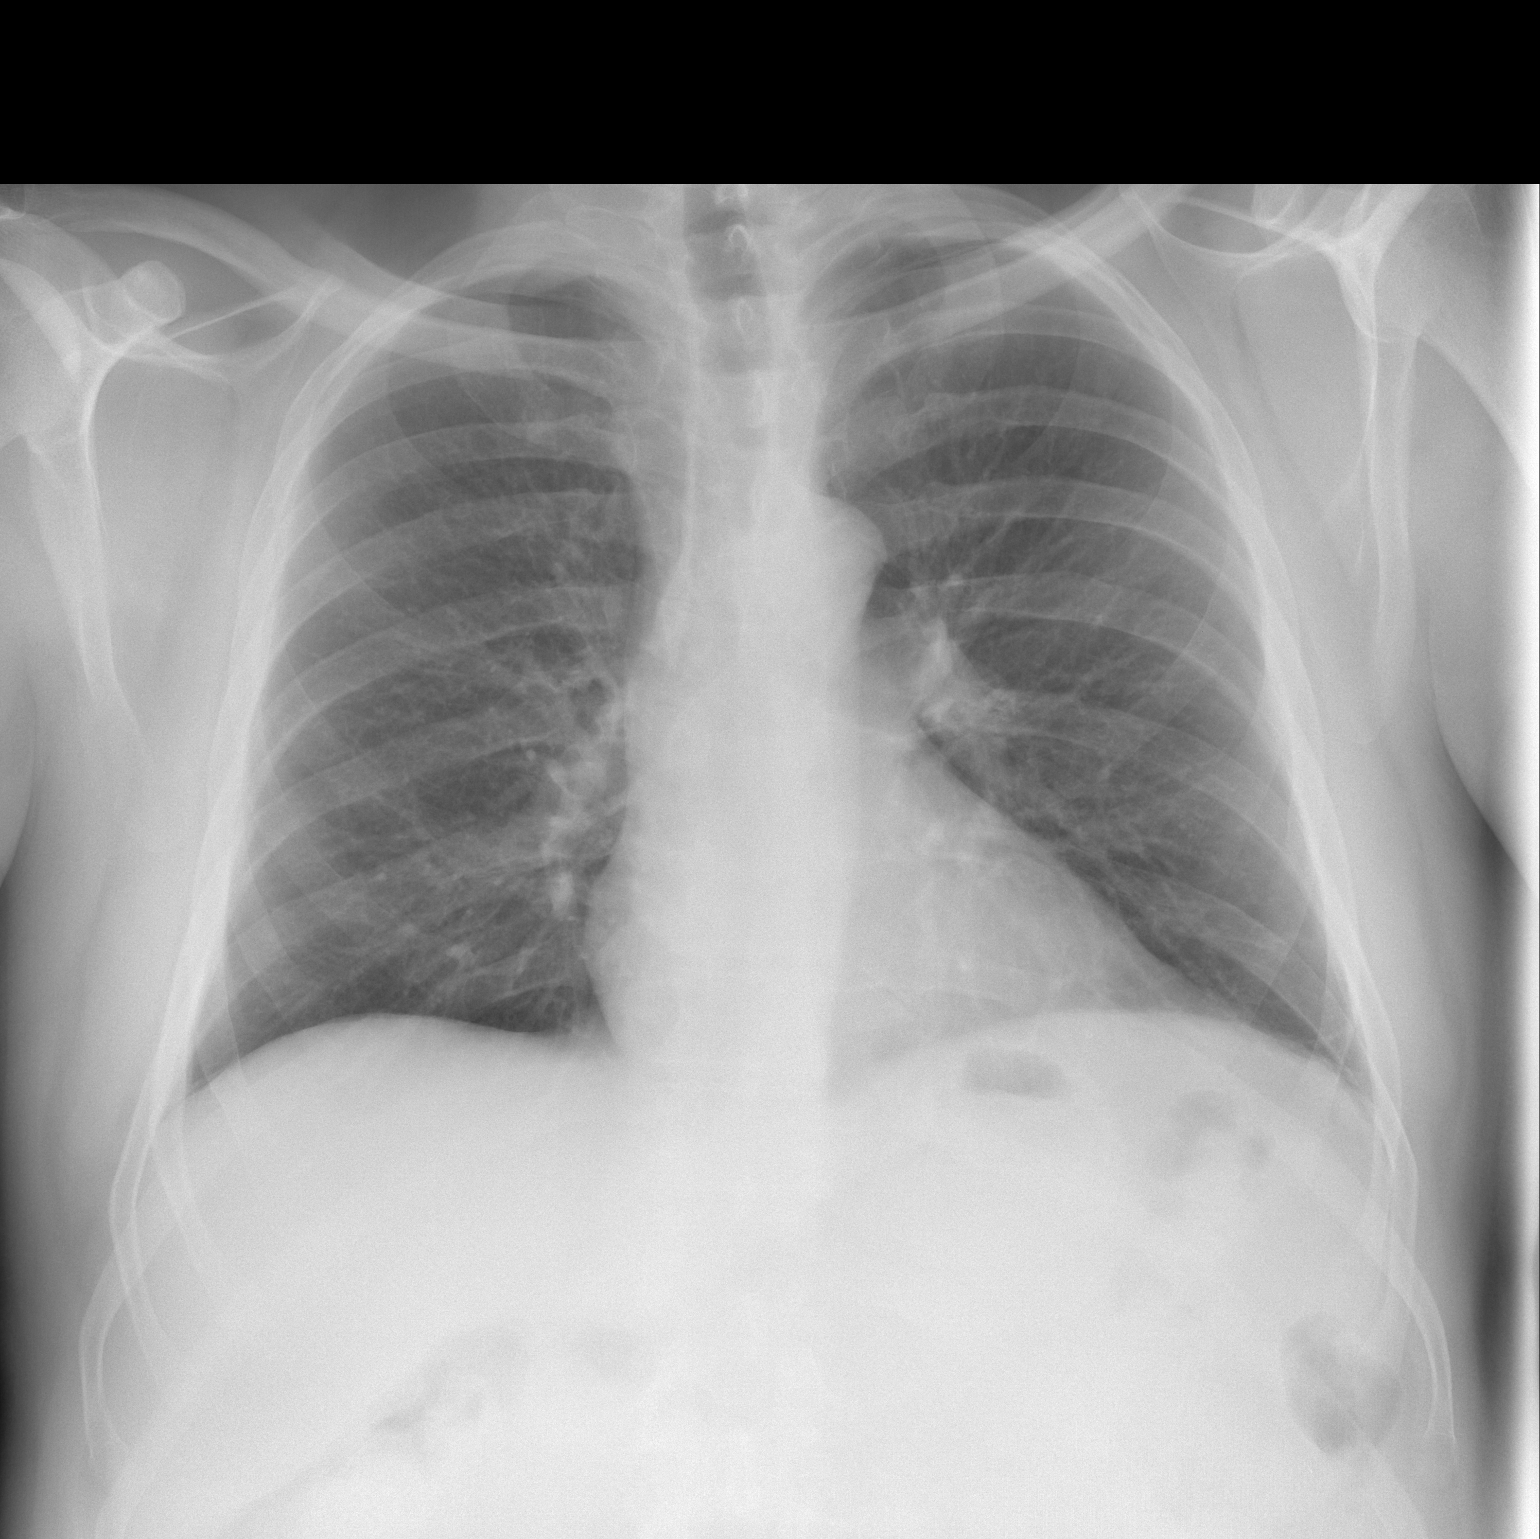

[w chest lat]
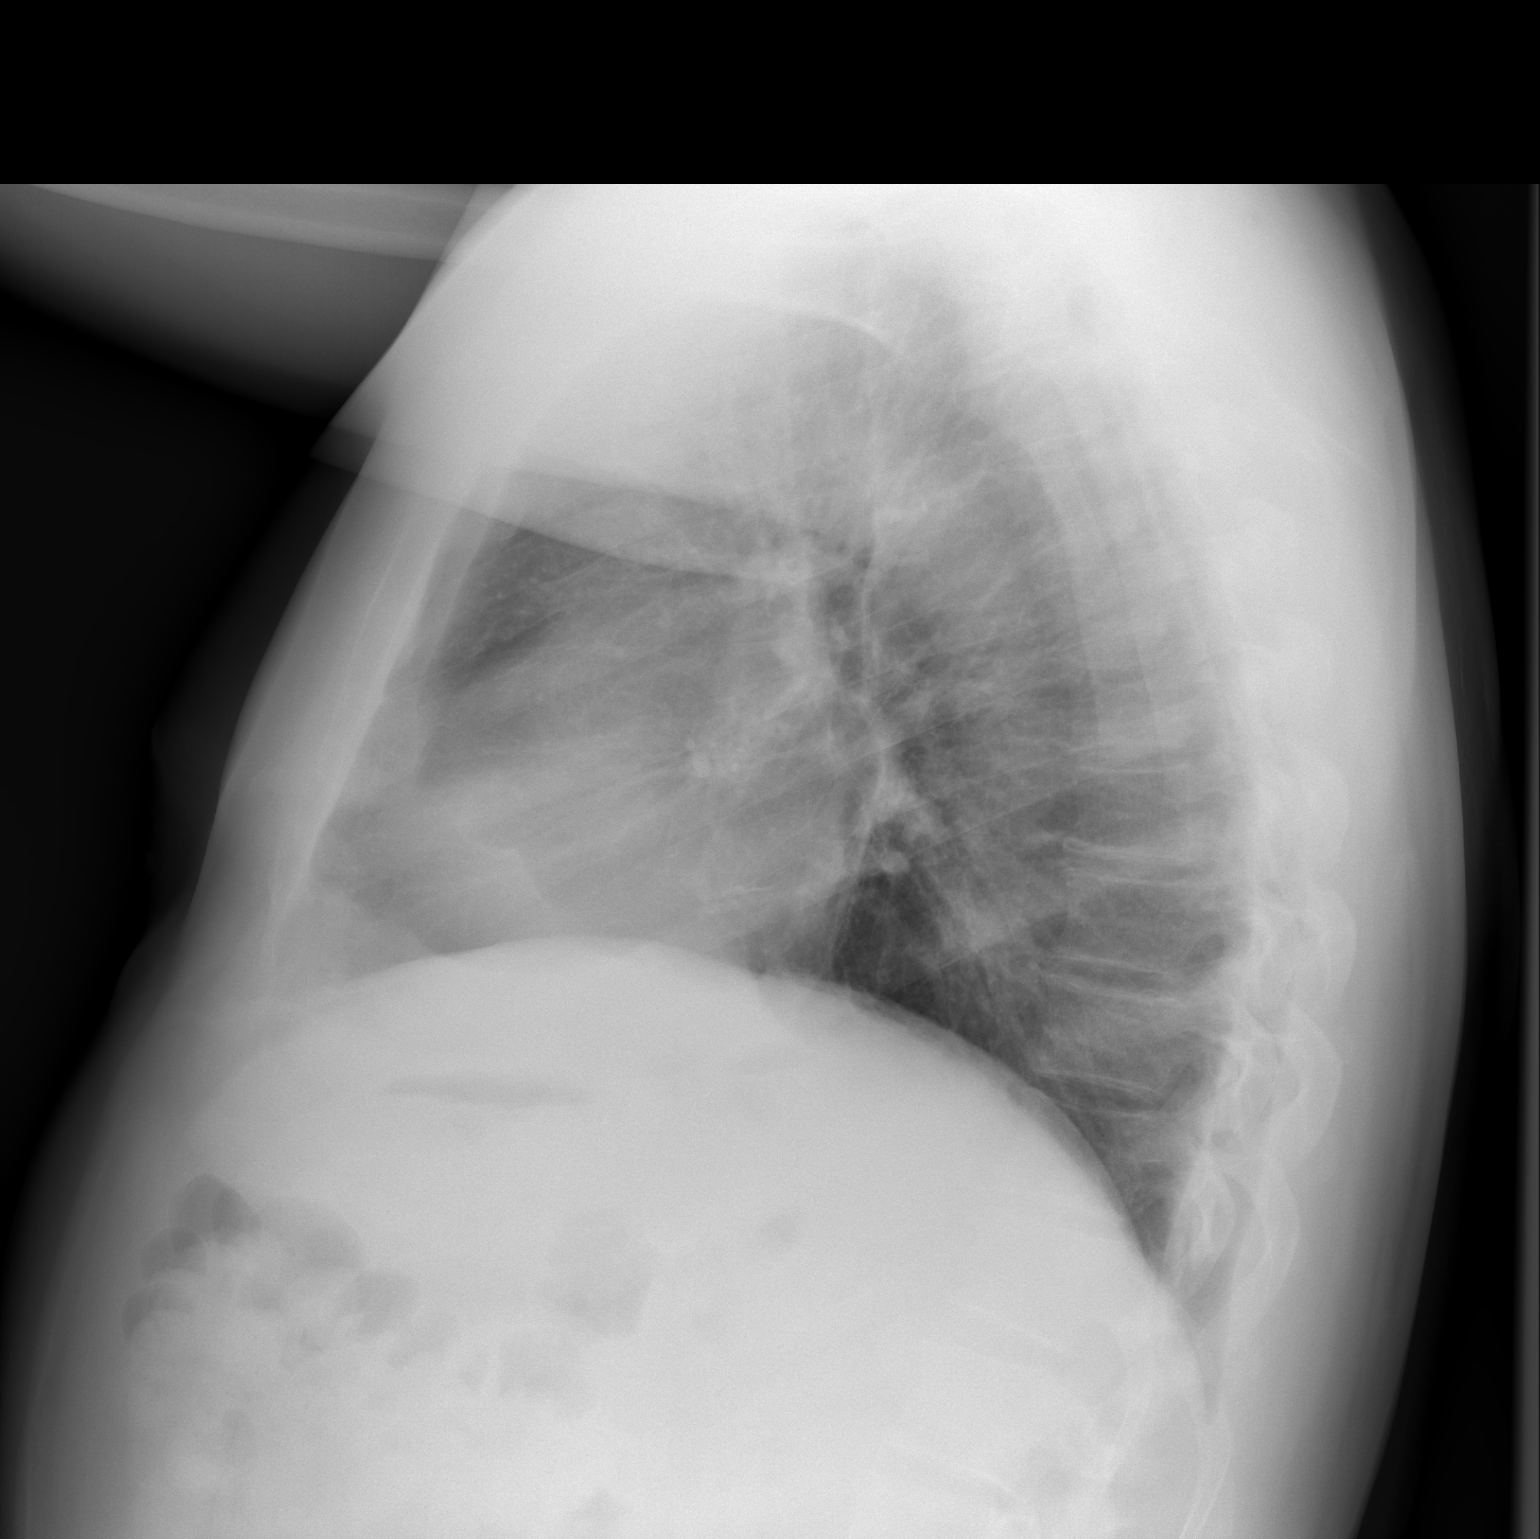

[2 of 2 positions shown; findings below may reference images not displayed]

FINDINGS: The heart size and mediastinal contours are within normal limits.
Both lungs are clear. The visualized skeletal structures are
unremarkable.
IMPRESSION: No active cardiopulmonary disease.

## 2015-10-08 ENCOUNTER — Other Ambulatory Visit: Payer: Self-pay | Admitting: Cardiology

## 2015-10-09 NOTE — Telephone Encounter (Signed)
Rx(s) sent to pharmacy electronically.  

## 2015-10-21 ENCOUNTER — Other Ambulatory Visit: Payer: Self-pay | Admitting: Cardiology

## 2015-10-26 ENCOUNTER — Ambulatory Visit (INDEPENDENT_AMBULATORY_CARE_PROVIDER_SITE_OTHER): Payer: BLUE CROSS/BLUE SHIELD | Admitting: Cardiology

## 2015-10-26 ENCOUNTER — Encounter: Payer: Self-pay | Admitting: Cardiology

## 2015-10-26 VITALS — BP 147/95 | HR 78 | Ht 72.0 in | Wt 238.6 lb

## 2015-10-26 DIAGNOSIS — E669 Obesity, unspecified: Secondary | ICD-10-CM

## 2015-10-26 DIAGNOSIS — I10 Essential (primary) hypertension: Secondary | ICD-10-CM | POA: Diagnosis not present

## 2015-10-26 DIAGNOSIS — I2510 Atherosclerotic heart disease of native coronary artery without angina pectoris: Secondary | ICD-10-CM | POA: Diagnosis not present

## 2015-10-26 DIAGNOSIS — E785 Hyperlipidemia, unspecified: Secondary | ICD-10-CM | POA: Diagnosis not present

## 2015-10-26 DIAGNOSIS — I482 Chronic atrial fibrillation, unspecified: Secondary | ICD-10-CM

## 2015-10-26 NOTE — Patient Instructions (Signed)
KEEP EYE ON YOUR BLOOD PRESSURE  WOULD LIKE IT STAY BELOW 140 /90  NO OTHER CHANGES WITH MEDICATIONS    Your physician wants you to follow-up in:6 MONTHS WITH DR Herbie BaltimoreHARDING You will receive a reminder letter in the mail two months in advance. If you don't receive a letter, please call our office to schedule the follow-up appointment.   If you need a refill on your cardiac medications before your next appointment, please call your pharmacy.

## 2015-10-26 NOTE — Progress Notes (Signed)
PCP: Lindwood Qua, MD  Clinic Note: Chief Complaint  Patient presents with  . Follow-up    Pt states no Sx  . Coronary Artery Disease    Known RCA occlusion  . Atrial Fibrillation    HPI: Chris Wilcox is a 60 y.o. male with a PMH below who presents today for ~8-9 month f/u for CAD & Afib (Chronic). Known RCA occlusion, but no active anginal symptoms or Afib RVR.  Advit Luginbill was last seen in Jan 2017.  Doing well.  Was recovering from Foot Sgx.    Recent Hospitalizations: none  Studies Reviewed: none  Interval History: Chris Wilcox presents today really doing well without any major complaints from cardiac standpoint. He has no sensation of being in atrial fibrillation. No active rapid or irregular heartbeat sensations. No anginal symptoms of chest pain/dyspnea with rest or exertion. Mild swelling in the foot that was broken, but otherwise no edema. No PND, orthopnea. No palpitations, lightheadedness, dizziness, weakness or syncope/near syncope. No TIA/amaurosis fugax symptoms. No melena, hematochezia, hematuria, or epstaxis. No claudication. He was recently diagnosed as having diabetes and was started on glipizide about 6 weeks ago.   ROS: A comprehensive was performed. Review of Systems  Constitutional: Negative for malaise/fatigue.  Respiratory: Negative for cough and shortness of breath.   Cardiovascular:       Per history of present illness  Musculoskeletal: Positive for joint pain (Less painful than before).       The operated foot still hurts if he does much activity  Neurological: Negative for dizziness.  Endo/Heme/Allergies: Does not bruise/bleed easily.  Psychiatric/Behavioral: Negative.   All other systems reviewed and are negative.   Past Medical History:  Diagnosis Date  . Arthritis   . Complication of anesthesia    slow to awaken after last knee arthroscopy  . Coronary artery disease, occlusive 2008   CATH -- CTO OF RCA - unable to cross for PCI; L-R  collateralization.   . Hyperlipidemia    on statin montiored by PCP  . Hypertension    well controlled  . Myocardial infarction (HCC) 2008  . Obesity   . PAF (paroxysmal atrial fibrillation) (HCC) 03/13/2011-04/11/2011   ATRIAL FIB wore Event MONITOR;  with CHADS/VASC score of 2 on anticoagulation  . Pre-diabetes    checks cbg in am  qday    Past Surgical History:  Procedure Laterality Date  . ARTHROSCOPY KNEE W/ DRILLING Left yrs ago   x 2  . CARDIAC CATHETERIZATION  01/20/2007   unsuccessful PCI of RCA  . DOPPLER ECHOCARDIOGRAPHY  06/10/2011   EF =>55% -ATRIAL FIB,BORDERLINE BILEAFLET MITRAL VALVE PROLAPSE,MILD TO MOD MITARL INSUFF;MILD AORTIC INSUFF  . index finger surgery Left 2009  . NM MYOCAR PERF WALL MOTION  03/13/2011   LEXISCAN--EF 55% LV MILDLY REDUCED;BASAL INFERIOR AND MID INFERIOR SCAR  . TOTAL KNEE ARTHROPLASTY Left 12/13/2012   Procedure: LEFT TOTAL KNEE ARTHROPLASTY;  Surgeon: Loanne Drilling, MD;  Location: [facility];  Service: Orthopedics;  Laterality: Left;    Prior to Admission medications   Medication Sig Start Date End Date Taking? Authorizing Provider  Acetaminophen (TYLENOL EXTRA STRENGTH PO) Take 1 tablet by mouth as needed (takes 1-2 as needed, usually in the afternoon.).   Yes Historical Provider, MD  aspirin EC 81 MG tablet Take 81 mg by mouth daily.   Yes Historical Provider, MD  atorvastatin (LIPITOR) 40 MG tablet TAKE 1 TABLET BY MOUTH EVERY EVENING. 05/21/15  Yes Marykay Lex, MD  diltiazem [facility]  CD) 240 MG 24 hr capsule TAKE ONE CAPSULE BY MOUTH EVERY MORNING 06/05/15  Yes Marykay Lexavid W Harding, MD  ezetimibe (ZETIA) 10 MG tablet Take 1 tablet (10 mg total) by mouth daily. 10/09/15  Yes Marykay Lexavid W Harding, MD  GLIPIZIDE PO Take 1 tablet by mouth daily.   Yes Historical Provider, MD  losartan (COZAAR) 50 MG tablet TAKE 1 TABLET BY MOUTH AT BEDTIME 10/22/15  Yes Marykay Lexavid W Harding, MD  methocarbamol (ROBAXIN) 500 MG tablet Take 1 tablet (500 mg total) by mouth  every 6 (six) hours as needed for muscle spasms. 12/16/12  Yes Avel Peacerew Perkins, PA-C  metoprolol (LOPRESSOR) 50 MG tablet TAKE 1/2 TABLET BY MOUTH TWICE A DAY 02/28/15  Yes Marykay Lexavid W Harding, MD  nitroGLYCERIN (NITROSTAT) 0.4 MG SL tablet Place 0.4 mg under the tongue every 5 (five) minutes as needed for chest pain.   Yes Historical Provider, MD  omega-3 fish oil (MAXEPA) 1000 MG CAPS capsule Take 3 capsules by mouth daily.   Yes Historical Provider, MD  traMADol (ULTRAM) 50 MG tablet Take 1-2 tablets (50-100 mg total) by mouth every 6 (six) hours as needed (mild pain). 12/16/12  Yes Avel Peacerew Perkins, PA-C  XARELTO 20 MG TABS tablet TAKE 1 TABLET BY MOUTH DAILY WITH SUPPER. 05/14/15  Yes Marykay Lexavid W Harding, MD     Allergies  Allergen Reactions  . Codeine Nausea And Vomiting    GI upset  . Hydrocodone Nausea Only    Social History   Social History  . Marital status: Married    Spouse name: N/A  . Number of children: N/A  . Years of education: N/A   Social History Main Topics  . Smoking status: Never Smoker  . Smokeless tobacco: Never Used  . Alcohol use No  . Drug use: No  . Sexual activity: Not Asked   Other Topics Concern  . None   Social History Narrative   Married father of 2. 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   He is back toexercising at least 15-20 minutes a day 3-4 times a week.  He is able to ride the bicycle for about 10-15 minutes and then needs to stop.   He denies any smoking or drinking history.   Family History  Problem Relation Age of Onset  . Diabetes Mother   . Diabetes Father   . Diabetes Maternal Grandmother   . Stroke Maternal Grandfather   . Cancer Paternal Grandmother      Wt Readings from Last 3 Encounters:  10/26/15 108.2 kg (238 lb 9.6 oz)  02/08/15 105.6 kg (232 lb 14.4 oz)  05/15/14 104.1 kg (229 lb 9.6 oz)    PHYSICAL EXAM BP (!) 147/95   Pulse 78   Ht 6' (1.829 m)   Wt 108.2 kg (238 lb 9.6 oz)   BMI 32.36 kg/m  - usually 130s/80s General  appearance: alert, cooperative, appears stated age, no distress, mildly obese and Well-nourished and well-groomed. Answers questions appropriately.  HEENT: [place]/AT, EOMI, MMM, anicteric sclera Neck: no adenopathy, no carotid bruit, no JVD and supple, symmetrical, trachea midline  Lungs: clear to auscultation bilaterally, normal percussion bilaterally and Nonlabored with good air movement  Heart: irregularly irregular rhythm - normal rate, S1, S2 normal, no S3 or S4 and No murmurs or rubs  Abdomen: soft, non-tender; bowel sounds normal; no masses, no organomegaly  Extremities: extremities normal, atraumatic, no cyanosis; mild R>L ~1+ LEE; NO, redness or tenderness in the calves or thighs, no ulcers, gangrene or trophic  changes  Pulses: 2+ and symmetric  Neurologic: Alert and oriented X 3, normal strength and tone. Normal symmetric reflexes. Normal coordination and gait  Mental status: Alert, oriented, thought content appropriate     Adult ECG Report  Rate:  78;  Rhythm: atrial fibrillation and artifact related ST-T wave abnormalities.; right axis deviation (110), inferior T-wave inversions  Narrative Interpretation:  Stable EKG.  Other studies Reviewed: Additional studies/ records that were reviewed today include:  Recent Labs:  Just had blood work by PCP.  ASSESSMENT / PLAN: Problem List Items Addressed This Visit    Obesity (BMI 30-39.9) (Chronic)    The patient understands the need to lose weight with diet and exercise. We have discussed specific strategies for this.      Essential hypertension (Chronic)    Again borderline pressures here today. He says they're better controlled at home, will symptoms the monitor for now, but Up would be to consider increasing ARB up to 100 mg.      Relevant Orders   EKG 12-Lead   Dyslipidemia, goal LDL below 70 (Chronic)    On statin plus Zetia. Labs are followed by PCP. Had apparently been controlled per PCPs notes.      Relevant Orders     EKG 12-Lead   Coronary artery disease, occlusive - RCA Occlusion, previously unable to cross occlusion. (Chronic)    Remains active without active anginal symptoms. On beta blocker and calcium channel blocker as well as ARB. On aspirin and statin.       Chronic atrial fibrillation (HCC); CHA2DS2-VASc Score 3 (CAD, DM-2 HTN) - Primary (Chronic)    Asymptomatic. Rate controlled on beta blocker and calcium channel blocker combination. Anticoagulated with warfarin without bleeding issues.      Relevant Orders   EKG 12-Lead    Other Visit Diagnoses   None.     Current medicines are reviewed at length with the patient today. (+/- concerns) none The following changes have been made: none KEEP EYE ON YOUR BLOOD PRESSURE  WOULD LIKE IT STAY BELOW 140 /90  NO OTHER CHANGES WITH MEDICATIONS    F/U: 6 MONTHS WITH DR Herbie Baltimore     Studies Ordered:   Orders Placed This Encounter  Procedures  . EKG 12-Lead      Bryan Lemma, M.D., M.S. Interventional Cardiologist   Pager # [phone]

## 2015-10-28 ENCOUNTER — Encounter: Payer: Self-pay | Admitting: Cardiology

## 2015-10-28 NOTE — Assessment & Plan Note (Signed)
Again borderline pressures here today. He says they're better controlled at home, will symptoms the monitor for now, but Up would be to consider increasing ARB up to 100 mg.

## 2015-10-28 NOTE — Assessment & Plan Note (Signed)
On statin plus Zetia. Labs are followed by PCP. Had apparently been controlled per PCPs notes.

## 2015-10-28 NOTE — Assessment & Plan Note (Signed)
Remains active without active anginal symptoms. On beta blocker and calcium channel blocker as well as ARB. On aspirin and statin.

## 2015-10-28 NOTE — Assessment & Plan Note (Signed)
Asymptomatic. Rate controlled on beta blocker and calcium channel blocker combination. Anticoagulated with warfarin without bleeding issues.

## 2015-10-28 NOTE — Assessment & Plan Note (Signed)
The patient understands the need to lose weight with diet and exercise. We have discussed specific strategies for this.  

## 2015-11-19 ENCOUNTER — Other Ambulatory Visit: Payer: Self-pay | Admitting: Cardiology

## 2016-02-15 ENCOUNTER — Other Ambulatory Visit: Payer: Self-pay | Admitting: Cardiology

## 2016-02-18 ENCOUNTER — Other Ambulatory Visit: Payer: Self-pay | Admitting: Cardiology

## 2016-02-18 NOTE — Telephone Encounter (Signed)
Rx(s) sent to pharmacy electronically.  

## 2016-02-24 ENCOUNTER — Other Ambulatory Visit: Payer: Self-pay | Admitting: Cardiology

## 2016-04-10 DIAGNOSIS — M545 Low back pain, unspecified: Secondary | ICD-10-CM | POA: Insufficient documentation

## 2016-04-23 ENCOUNTER — Ambulatory Visit (INDEPENDENT_AMBULATORY_CARE_PROVIDER_SITE_OTHER): Payer: 59 | Admitting: Cardiology

## 2016-04-23 ENCOUNTER — Encounter: Payer: Self-pay | Admitting: Cardiology

## 2016-04-23 VITALS — BP 124/90 | HR 62 | Ht 72.0 in | Wt 240.0 lb

## 2016-04-23 DIAGNOSIS — I482 Chronic atrial fibrillation, unspecified: Secondary | ICD-10-CM

## 2016-04-23 DIAGNOSIS — I2510 Atherosclerotic heart disease of native coronary artery without angina pectoris: Secondary | ICD-10-CM | POA: Diagnosis not present

## 2016-04-23 DIAGNOSIS — I10 Essential (primary) hypertension: Secondary | ICD-10-CM | POA: Diagnosis not present

## 2016-04-23 DIAGNOSIS — E785 Hyperlipidemia, unspecified: Secondary | ICD-10-CM | POA: Diagnosis not present

## 2016-04-23 DIAGNOSIS — E669 Obesity, unspecified: Secondary | ICD-10-CM | POA: Diagnosis not present

## 2016-04-23 NOTE — Assessment & Plan Note (Signed)
Stable without any active symptoms from his RCA CTO. On stable dose of beta blocker, calcium channel blocker and ARB. Also on aspirin and statin.  No further evaluation this time

## 2016-04-23 NOTE — Assessment & Plan Note (Addendum)
Essentially asymptomatic chronic/persistent atrial fibrillation is rate controlled on current dose of beta blocker plus calcium channel blocker Converted from warfarin to Xarelto with no bleeding issues.  No issues with rate control.

## 2016-04-23 NOTE — Assessment & Plan Note (Signed)
Unfortunately, his weight is on the wrong direction. We again talked about the importance of dietary changes and increased exercise. He thinks it is simply to do with the fact that he is less active over the winter months.

## 2016-04-23 NOTE — Assessment & Plan Note (Signed)
Fairly well controlled today on current medications. No changes necessary.

## 2016-04-23 NOTE — Assessment & Plan Note (Signed)
On statin plus Zetia. Labs monitored by PCP. Goal LDL is less than 70. I have asked that we get a copy of labs from PCP

## 2016-04-23 NOTE — Patient Instructions (Addendum)
Medication Instructions:  Your physician recommends that you continue on your current medications as directed. Please refer to the Current Medication list given to you today.  Labwork: NONE   Testing/Procedures: NONE   Follow-Up: Your physician wants you to follow-up in: 6 MONTHS WITH DR HARDING. You will receive a reminder letter in the mail two months in advance. If you don't receive a letter, please call our office to schedule the follow-up appointment.  Any Other Special Instructions Will Be Listed Below (If Applicable).     If you need a refill on your cardiac medications before your next appointment, please call your pharmacy.  

## 2016-04-23 NOTE — Progress Notes (Signed)
PCP: Lindwood QuaHOFFMAN,BYRON, MD  Clinic Note: Chief Complaint  Patient presents with  . Follow-up    no chest pain, shortness of breath pain or cramping in legs, lightheaded or dizziness, has occassional edema  . Coronary Artery Disease    Known occluded RCA  . Atrial Fibrillation    Persistent/chronic    HPI: Chris Wilcox is a 61 y.o. male with a PMH below who presents today for ~6 month f/u for CAD & Afib (Chronic). Known RCA occlusion, but no active anginal symptoms or Afib RVR.  Chris Wilcox was last seen in Sept 2017.  Doing well.    Recent Hospitalizations: none  Studies Reviewed: none  Interval History: Chris Wilcox presents today really doing well without any major complaints from cardiac standpoint. HeStill denies any sensation of being in atrial fibrillation - he limits his heart rate going up when it supposed to with exercise.. No active rapid or irregular heartbeat sensations. No anginal symptoms of chest pain/dyspnea with rest or exertion. Mild swelling in the foot that was broken, but otherwise no edema. No PND, orthopnea. No palpitations, lightheadedness, dizziness, weakness or syncope/near syncope. No TIA/amaurosis fugax symptoms. No melena, hematochezia, hematuria, or epstaxis. No claudication. He was recently diagnosed as having diabetes -no symptoms of hypoglycemia  Says he rides his stationary bike about 4-5 days a week for at least 15-20 minutes a time.   ROS: A comprehensive was performed. Review of Systems  Constitutional: Negative for malaise/fatigue.  Respiratory: Negative for cough and shortness of breath.   Cardiovascular:       Per history of present illness  Musculoskeletal: Positive for joint pain (Less painful than before).       The operated foot still hurts if he does much activity  Neurological: Negative for dizziness.  Endo/Heme/Allergies: Does not bruise/bleed easily.  Psychiatric/Behavioral: Negative.   All other systems reviewed and are negative.   Past  Medical History:  Diagnosis Date  . Arthritis   . Complication of anesthesia    slow to awaken after last knee arthroscopy  . Coronary artery disease, occlusive 2008   CATH -- CTO OF RCA - unable to cross for PCI; L-R collateralization.   . Hyperlipidemia    on statin montiored by PCP  . Hypertension    well controlled  . Myocardial infarction 2008  . Obesity   . PAF (paroxysmal atrial fibrillation) (HCC) 03/13/2011-04/11/2011   ATRIAL FIB wore Event MONITOR;  with CHADS/VASC score of 2 on anticoagulation  . Pre-diabetes    checks cbg in am  qday    Past Surgical History:  Procedure Laterality Date  . ARTHROSCOPY KNEE W/ DRILLING Left yrs ago   x 2  . CARDIAC CATHETERIZATION  01/20/2007   unsuccessful PCI of RCA  . DOPPLER ECHOCARDIOGRAPHY  06/10/2011   EF =>55% -ATRIAL FIB,BORDERLINE BILEAFLET MITRAL VALVE PROLAPSE,MILD TO MOD MITARL INSUFF;MILD AORTIC INSUFF  . index finger surgery Left 2009  . NM MYOCAR PERF WALL MOTION  03/13/2011   LEXISCAN--EF 55% LV MILDLY REDUCED;BASAL INFERIOR AND MID INFERIOR SCAR  . TOTAL KNEE ARTHROPLASTY Left 12/13/2012   Procedure: LEFT TOTAL KNEE ARTHROPLASTY;  Surgeon: Loanne DrillingFrank V Aluisio, MD;  Location: [facility];  Service: Orthopedics;  Laterality: Left;    Current Meds  Medication Sig  . Acetaminophen (TYLENOL EXTRA STRENGTH PO) Take 1 tablet by mouth as needed (takes 1-2 as needed, usually in the afternoon.).  Marland Kitchen. aspirin EC 81 MG tablet Take 81 mg by mouth every other day.   .Marland Kitchen  atorvastatin (LIPITOR) 40 MG tablet TAKE 1 TABLET BY MOUTH EVERY EVENING.  Marland Kitchen diltiazem (CARDIZEM CD) 240 MG 24 hr capsule TAKE ONE CAPSULE BY MOUTH EVERY MORNING  . ezetimibe (ZETIA) 10 MG tablet TAKE 1 TABLET (10 MG TOTAL) BY MOUTH DAILY.  Marland Kitchen GLIPIZIDE PO Take 1 tablet by mouth daily.  Marland Kitchen losartan (COZAAR) 50 MG tablet TAKE 1 TABLET BY MOUTH AT BEDTIME  . methocarbamol (ROBAXIN) 500 MG tablet Take 1 tablet (500 mg total) by mouth every 6 (six) hours as needed for muscle  spasms.  . metoprolol (LOPRESSOR) 50 MG tablet Take 0.5 tablets (25 mg total) by mouth 2 (two) times daily.  . nitroGLYCERIN (NITROSTAT) 0.4 MG SL tablet Place 0.4 mg under the tongue every 5 (five) minutes as needed for chest pain.  Marland Kitchen omega-3 fish oil (MAXEPA) 1000 MG CAPS capsule Take 3 capsules by mouth daily.  . traMADol (ULTRAM) 50 MG tablet Take 1-2 tablets (50-100 mg total) by mouth every 6 (six) hours as needed (mild pain).  Carlena Hurl 20 MG TABS tablet TAKE 1 TABLET BY MOUTH DAILY WITH SUPPER.     Allergies  Allergen Reactions  . Codeine Nausea And Vomiting    GI upset  . Hydrocodone Nausea Only    Social History   Social History  . Marital status: Married    Spouse name: N/A  . Number of children: N/A  . Years of education: N/A   Social History Main Topics  . Smoking status: Never Smoker  . Smokeless tobacco: Never Used  . Alcohol use No  . Drug use: No  . Sexual activity: Not Asked   Other Topics Concern  . 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   He is back toexercising at least 15-20 minutes a day 3-4 times a week.  He is able to ride the bicycle for about 10-15 minutes and then needs to stop.   He denies any smoking or drinking history.   Family History  Problem Relation Age of Onset  . Diabetes Mother   . Diabetes Father   . Diabetes Maternal Grandmother   . Stroke Maternal Grandfather   . Cancer Paternal Grandmother      Wt Readings from Last 3 Encounters:  04/23/16 108.9 kg (240 lb)  10/26/15 108.2 kg (238 lb 9.6 oz)  02/08/15 105.6 kg (232 lb 14.4 oz)    PHYSICAL EXAM BP 124/90   Pulse 62   Ht 6' (1.829 m)   Wt 108.9 kg (240 lb)   BMI 32.55 kg/m  - usually 130s/80s General appearance: alert, cooperative, appears stated age, no distress, mildly obese and Well-nourished and well-groomed. Answers questions appropriately.  Mental status: Alert, oriented, thought content appropriate  HEENT: [place]/AT,  EOMI, MMM, anicteric sclera Neck: no adenopathy, no carotid bruit, no JVD and supple, symmetrical, trachea midline  Lungs: clear to auscultation bilaterally, normal percussion bilaterally and Nonlabored with good air movement  Heart: irregularly irregular rhythm - normal rate, S1, S2 normal, no S3 or S4 and No murmurs or rubs  Abdomen: soft, non-tender; bowel sounds normal; no masses, no organomegaly  Extremities: extremities normal, atraumatic, no cyanosis; mild R>L ~1+ LEE;minimal venous stasis changes Pulses: 2+ and symmetric  Neurologic: Alert and oriented X 3, normal strength    Adult ECG Report  Rate:  62;  Rhythm: atrial fibrillation and artifact related ST-T wave abnormalities.; normal axis.  inferior T-wave inversions less prominent.   Narrative  Interpretation:  Stable EKG.  Other studies Reviewed: Additional studies/ records that were reviewed today include:  Recent Labs:  Just had blood work by PCP 1-2 weeks ago  ASSESSMENT / PLAN: Problem List Items Addressed This Visit    Chronic atrial fibrillation (HCC); CHA2DS2-VASc Score 3 (CAD, DM-2 HTN) - Primary (Chronic)    Essentially asymptomatic chronic/persistent atrial fibrillation is rate controlled on current dose of beta blocker plus calcium channel blocker Converted from warfarin to Xarelto with no bleeding issues.  No issues with rate control.      Coronary artery disease, occlusive - RCA Occlusion, previously unable to cross occlusion. (Chronic)    Stable without any active symptoms from his RCA CTO. On stable dose of beta blocker, calcium channel blocker and ARB. Also on aspirin and statin.  No further evaluation this time      Dyslipidemia, goal LDL below 70 (Chronic)    On statin plus Zetia. Labs monitored by PCP. Goal LDL is less than 70. I have asked that we get a copy of labs from PCP      Essential hypertension (Chronic)    Fairly well controlled today on current medications. No changes necessary.       Relevant Orders   EKG 12-Lead   Obesity (BMI 30-39.9) (Chronic)    Unfortunately, his weight is on the wrong direction. We again talked about the importance of dietary changes and increased exercise. He thinks it is simply to do with the fact that he is less active over the winter months.         Current medicines are reviewed at length with the patient today. (+/- concerns) none The following changes have been made: none Patient Instructions  Medication Instructions:  Your physician recommends that you continue on your current medications as directed. Please refer to the Current Medication list given to you today.  Labwork: NONE    Testing/Procedures: NONE   Follow-Up: Your physician wants you to follow-up in: 6 MONTHS WITH DR Luken Shadowens. You will receive a reminder letter in the mail two months in advance. If you don't receive a letter, please call our office to schedule the follow-up appointment.  Any Other Special Instructions Will Be Listed Below (If Applicable).  If you need a refill on your cardiac medications before your next appointment, please call your pharmacy.     Studies Ordered:   Orders Placed This Encounter  Procedures  . EKG 12-Lead      Bryan Lemma, M.D., M.S. Interventional Cardiologist   Pager # [phone]

## 2016-05-10 ENCOUNTER — Other Ambulatory Visit: Payer: Self-pay | Admitting: Cardiology

## 2016-10-14 ENCOUNTER — Other Ambulatory Visit: Payer: Self-pay | Admitting: Cardiology

## 2016-10-23 ENCOUNTER — Ambulatory Visit: Payer: 59 | Admitting: Cardiology

## 2016-10-31 ENCOUNTER — Encounter: Payer: Self-pay | Admitting: Cardiology

## 2016-10-31 ENCOUNTER — Ambulatory Visit (INDEPENDENT_AMBULATORY_CARE_PROVIDER_SITE_OTHER): Payer: 59 | Admitting: Cardiology

## 2016-10-31 VITALS — BP 140/80 | HR 58 | Ht 72.0 in | Wt 238.0 lb

## 2016-10-31 DIAGNOSIS — E785 Hyperlipidemia, unspecified: Secondary | ICD-10-CM | POA: Diagnosis not present

## 2016-10-31 DIAGNOSIS — I482 Chronic atrial fibrillation: Secondary | ICD-10-CM | POA: Diagnosis not present

## 2016-10-31 DIAGNOSIS — I10 Essential (primary) hypertension: Secondary | ICD-10-CM

## 2016-10-31 DIAGNOSIS — I4821 Permanent atrial fibrillation: Secondary | ICD-10-CM

## 2016-10-31 DIAGNOSIS — I2510 Atherosclerotic heart disease of native coronary artery without angina pectoris: Secondary | ICD-10-CM | POA: Diagnosis not present

## 2016-10-31 DIAGNOSIS — E669 Obesity, unspecified: Secondary | ICD-10-CM

## 2016-10-31 NOTE — Progress Notes (Signed)
PCP: Lindwood Qua, MD  Clinic Note: Chief Complaint  Patient presents with  . Follow-up  . Edema    Ankles.    HPI: Chris Wilcox is a 61 y.o. male with a PMH below who presents today for six-month follow-up for CAD and essentially permanent A. fib. He has history of known RCA occlusion but with no angina symptoms. He has had long-standing persistent/permanent A. Fib - rate controlled with diltiazem and metoprolol. Anticoagulated with Xarelto.  Aniket Lemen was last seen in March 2018 -doing well, stable  Recent Hospitalizations: none  Studies Personally Reviewed - (if available, images/films reviewed: From Epic Chart or Care Everywhere)  none  Interval History: Chris Wilcox returns for routine follow-up today overall doing very well. He says that he has had maybe 2 or 3 episodes where he is felt his heart rate going fast.  Usually they only last about 2-3 minutes at the most, the most recent one was about a month ago shortly after he started wearing his Fit Bit -- it recorded a brief spell of HR into the 120s.  uring the spells he basally starts feeling a warm flushed feeling all over a little bit lightheaded, but no significant dizziness or wooziness. Importantly, he denies any chest tightness or pressure when his heart rate was fast. Otherwise at baseline is very active and rides a stationary bike most days out of the week for at least 15 unusually 20-25 minutes.  He denies any chest tightness /pressure or shortness of breath at rest or with exertion.  He has mild end of the day ankle swelling and is socks, but is his feet up or takes his socks off. No PND or orthopnea.  No palpitations, lightheadedness (except ~a bit warm & lightheaded 2-3 x with increased HR into ~120s). Nothing set if off - just random. No dizziness, weakness or syncope/near syncope. No TIA/amaurosis fugax symptoms. No claudication.  ROS: A comprehensive was performed. Review of Systems  Constitutional: Negative for  chills, fever and malaise/fatigue.  HENT: Negative for nosebleeds.   Respiratory: Negative for cough and shortness of breath.   Cardiovascular: Negative for claudication.  Gastrointestinal: Negative for abdominal pain, blood in stool and heartburn.  Genitourinary: Negative for hematuria.  Musculoskeletal: Positive for back pain and joint pain (ankles & R (natural) knee).  Neurological: Negative.  Negative for focal weakness.  All other systems reviewed and are negative.  I have reviewed and (if needed) personally updated the patient's problem list, medications, allergies, past medical and surgical history, social and family history.   Past Medical History:  Diagnosis Date  . Arthritis   . Complication of anesthesia    slow to awaken after last knee arthroscopy  . Coronary artery disease, occlusive 2008   CATH -- CTO OF RCA - unable to cross for PCI; L-R collateralization.   . Hyperlipidemia    on statin montiored by PCP  . Hypertension    well controlled  . Myocardial infarction (HCC) 2008  . Obesity   . PAF (paroxysmal atrial fibrillation) (HCC) 03/13/2011-04/11/2011   ATRIAL FIB wore Event MONITOR;  with CHADS/VASC score of 2 on anticoagulation  . Pre-diabetes    checks cbg in am  qday    Past Surgical History:  Procedure Laterality Date  . ARTHROSCOPY KNEE W/ DRILLING Left yrs ago   x 2  . CARDIAC CATHETERIZATION  01/20/2007   unsuccessful PCI of RCA  . DOPPLER ECHOCARDIOGRAPHY  06/10/2011   EF =>55% -ATRIAL FIB,BORDERLINE BILEAFLET MITRAL VALVE  PROLAPSE,MILD TO MOD MITARL INSUFF;MILD AORTIC INSUFF  . index finger surgery Left 2009  . NM MYOCAR PERF WALL MOTION  03/13/2011   LEXISCAN--EF 55% LV MILDLY REDUCED;BASAL INFERIOR AND MID INFERIOR SCAR  . TOTAL KNEE ARTHROPLASTY Left 12/13/2012   Procedure: LEFT TOTAL KNEE ARTHROPLASTY;  Surgeon: Loanne Drilling, MD;  Location: [facility];  Service: Orthopedics;  Laterality: Left;    Current Meds  Medication Sig  . Acetaminophen  (TYLENOL EXTRA STRENGTH PO) Take 1 tablet by mouth as needed (takes 1-2 as needed, usually in the afternoon.).  Marland Kitchen aspirin EC 81 MG tablet Take 81 mg by mouth every other day.   Marland Kitchen atorvastatin (LIPITOR) 40 MG tablet TAKE 1 TABLET BY MOUTH EVERY EVENING.  Marland Kitchen diltiazem (CARDIZEM CD) 240 MG 24 hr capsule TAKE ONE CAPSULE BY MOUTH EVERY MORNING  . ezetimibe (ZETIA) 10 MG tablet TAKE 1 TABLET (10 MG TOTAL) BY MOUTH DAILY.  Marland Kitchen GLIPIZIDE PO Take 1 tablet by mouth daily.  Marland Kitchen losartan (COZAAR) 50 MG tablet TAKE 1 TABLET BY MOUTH AT BEDTIME  . methocarbamol (ROBAXIN) 500 MG tablet Take 1 tablet (500 mg total) by mouth every 6 (six) hours as needed for muscle spasms.  . metoprolol tartrate (LOPRESSOR) 50 MG tablet TAKE 1/2 TABLET (25 MG TOTAL) BY MOUTH 2 (TWO) TIMES DAILY.  . nitroGLYCERIN (NITROSTAT) 0.4 MG SL tablet Place 0.4 mg under the tongue every 5 (five) minutes as needed for chest pain.  Marland Kitchen omega-3 fish oil (MAXEPA) 1000 MG CAPS capsule Take 3 capsules by mouth daily.  . traMADol (ULTRAM) 50 MG tablet Take 1-2 tablets (50-100 mg total) by mouth every 6 (six) hours as needed (mild pain).  Carlena Hurl 20 MG TABS tablet TAKE 1 TABLET BY MOUTH DAILY WITH SUPPER.    Allergies  Allergen Reactions  . Codeine Nausea And Vomiting    GI upset  . Hydrocodone Nausea Only    Social History   Social History  . Marital status: Married    Spouse name: N/A  . Number of children: N/A  . Years of education: N/A   Social History Main Topics  . Smoking status: Never Smoker  . Smokeless tobacco: Never Used  . Alcohol use No  . Drug use: No  . Sexual activity: Not Asked   Other Topics Concern  . 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   He is back toexercising at least 15-20 minutes a day 3-4 times a week.  He is able to ride the bicycle for about 10-15 minutes and then needs to stop.   He denies any smoking or drinking history.    family history includes  Cancer in his paternal grandmother; Diabetes in his father, maternal grandmother, and mother; Stroke in his maternal grandfather.  Wt Readings from Last 3 Encounters:  10/31/16 238 lb (108 kg)  04/23/16 240 lb (108.9 kg)  10/26/15 238 lb 9.6 oz (108.2 kg)    PHYSICAL EXAM BP 140/80   Pulse (!) 58   Ht 6' (1.829 m)   Wt 238 lb (108 kg)   BMI 32.28 kg/m  Physical Exam  Constitutional: He is oriented to person, place, and time. He appears well-developed and well-nourished.  Well-groomed. Healthy-appearing  HENT:  Head: Normocephalic and atraumatic.  Eyes: EOM are normal. No scleral icterus.  Neck: Normal range of motion. Neck supple. No hepatojugular reflux and no JVD present. Carotid bruit is not present.  Cardiovascular: Normal  rate, normal heart sounds and intact distal pulses.  An irregularly irregular rhythm present. PMI is not displaced.  Exam reveals no gallop and no friction rub.   No murmur heard. Pulmonary/Chest: Effort normal and breath sounds normal. No respiratory distress. He has no wheezes. He has no rales.  Abdominal: Soft. Bowel sounds are normal. He exhibits no distension. There is no tenderness. There is no rebound.  Musculoskeletal: Normal range of motion. He exhibits edema (trace-1+ bilateral ankle swelling at the socks).  Neurological: He is alert and oriented to person, place, and time.  Skin: Skin is warm and dry. No rash noted. No erythema.  Psychiatric: He has a normal mood and affect. His behavior is normal. Judgment and thought content normal.  Nursing note and vitals reviewed.   Adult ECG Report  Rate: 58 ;  Rhythm: atrial fibrillation and slow ventricular response. Poor R-wave progression suggests possible septal MI, age undetermined.;   Narrative Interpretation: stable EKG   Other studies Reviewed: Additional studies/ records that were reviewed today include:  Recent Labs:  Followed by PCP  Recent Labs: March 2018  Na+ 140, K+ 4.3, Cl- 103, HCO3-  26 , BUN 20, Cr 1.10, Glu 157 (A1c 8.0), Ca2+ 8.9; AST 25, ALT, 47, AlkP 117  CBC: W 7.1, H/H 14.8/42.8, Plt 181  TC 119, TG 61, HDL 42, LDL 65   ASSESSMENT / PLAN: Problem List Items Addressed This Visit    Coronary artery disease, occlusive - RCA Occlusion, previously unable to cross occlusion. (Chronic)    Known stable coronary artery disease with occluded RCA. Has had no recurrent anginal symptoms despite having heart rates up into the 120s. Stays active with exercise and no angina or heart failure symptoms.  He remains on stable dose of beta blocker and calcium channel blocker as well as ARB. He is on aspirin and statin. No further evaluation at this time      Relevant Orders   EKG 12-Lead   Dyslipidemia, goal LDL below 70 (Chronic)    All statin plus Zetia. Labs followed by PCP. Goal LDL is less than 70. Last recorded labs were from March. Well-controlled. Continue current meds      Relevant Orders   EKG 12-Lead   Essential hypertension (Chronic)    Usually well controlled. Borderline elevated today. Will simply continue current medications, but monitor pressures      Relevant Orders   EKG 12-Lead   Obesity (BMI 30-39.9) (Chronic)    Stable weight. Has not actually lost weight, but has not gained. Discussed importance of continued exercise and potentially increasing exercise. Perhaps riding his bike more like 30 minutes and more vigorously would help. Also discussed dietary modification.      Relevant Orders   EKG 12-Lead   Permanent atrial fibrillation (HCC): CHA2DS2-VASc Score 3 (CAD, DM-2 HTN) - Primary (Chronic)    Pre-much asymptomatic with very few breakthroughs. He is taking accommodation diltiazem and low-dose metoprolol. I only recommended that if he were to have a prolonged episode of fast heartbeats that he should take an additional half dose of metoprolol. (He is taking a half tablet twice a day same take a half on a when necessary basis)  Doing well on  Xareltofor anticoagulation. No major bleeding issues.      Relevant Orders   EKG 12-Lead     Current medicines are reviewed at length with the patient today. (+/- concerns) none The following changes have been made: if he has a prolonged episode of  fast heart rates, he should take an additional half dose of metoprolol.  Patient Instructions   RECOMMEND IF YOU HAVE AN EPISODE OF FAST HEARTBEAT , MAY TAKE AN EXTRA 1/2 DOSE OF METOPROLOL TARTRATE.  OTHERWISE NO CHANGES WITH CURRENT TREATMENT   Your physician wants you to follow-up in 6 MONTHS WITH DR Elisha Mcgruder. You will receive a reminder letter in the mail two months in advance. If you don't receive a letter, please call our office to schedule the follow-up appointment.    Studies Ordered:   Orders Placed This Encounter  Procedures  . EKG 12-Lead      Bryan Lemma, M.D., M.S. Interventional Cardiologist   Pager # [phone] Phone # [phone] [address] [place], [place] [address]

## 2016-10-31 NOTE — Assessment & Plan Note (Signed)
Stable weight. Has not actually lost weight, but has not gained. Discussed importance of continued exercise and potentially increasing exercise. Perhaps riding his bike more like 30 minutes and more vigorously would help. Also discussed dietary modification.

## 2016-10-31 NOTE — Assessment & Plan Note (Signed)
All statin plus Zetia. Labs followed by PCP. Goal LDL is less than 70. Last recorded labs were from March. Well-controlled. Continue current meds

## 2016-10-31 NOTE — Assessment & Plan Note (Signed)
Usually well controlled. Borderline elevated today. Will simply continue current medications, but monitor pressures

## 2016-10-31 NOTE — Assessment & Plan Note (Signed)
Known stable coronary artery disease with occluded RCA. Has had no recurrent anginal symptoms despite having heart rates up into the 120s. Stays active with exercise and no angina or heart failure symptoms.  He remains on stable dose of beta blocker and calcium channel blocker as well as ARB. He is on aspirin and statin. No further evaluation at this time

## 2016-10-31 NOTE — Patient Instructions (Addendum)
  RECOMMEND IF YOU HAVE AN EPISODE OF FAST HEARTBEAT , MAY TAKE AN EXTRA 1/2 DOSE OF METOPROLOL TARTRATE.  OTHERWISE NO CHANGES WITH CURRENT TREATMENT   Your physician wants you to follow-up in 6 MONTHS WITH DR HARDING. You will receive a reminder letter in the mail two months in advance. If you don't receive a letter, please call our office to schedule the follow-up appointment.

## 2016-10-31 NOTE — Assessment & Plan Note (Signed)
Pre-much asymptomatic with very few breakthroughs. He is taking accommodation diltiazem and low-dose metoprolol. I only recommended that if he were to have a prolonged episode of fast heartbeats that he should take an additional half dose of metoprolol. (He is taking a half tablet twice a day same take a half on a when necessary basis)  Doing well on Xareltofor anticoagulation. No major bleeding issues.

## 2016-11-02 ENCOUNTER — Other Ambulatory Visit: Payer: Self-pay | Admitting: Cardiology

## 2016-11-11 ENCOUNTER — Other Ambulatory Visit: Payer: Self-pay | Admitting: Cardiology

## 2016-11-22 ENCOUNTER — Other Ambulatory Visit: Payer: Self-pay | Admitting: Cardiology

## 2017-02-01 ENCOUNTER — Other Ambulatory Visit: Payer: Self-pay | Admitting: Cardiology

## 2017-04-30 ENCOUNTER — Other Ambulatory Visit: Payer: Self-pay | Admitting: Cardiology

## 2017-05-01 NOTE — Telephone Encounter (Signed)
Rx(s) sent to pharmacy electronically.  

## 2017-05-05 ENCOUNTER — Other Ambulatory Visit: Payer: Self-pay | Admitting: Cardiology

## 2017-05-09 ENCOUNTER — Other Ambulatory Visit: Payer: Self-pay | Admitting: Cardiology

## 2017-05-24 ENCOUNTER — Other Ambulatory Visit: Payer: Self-pay | Admitting: Cardiology

## 2017-05-25 NOTE — Telephone Encounter (Signed)
Rx sent to pharmacy   

## 2017-06-03 ENCOUNTER — Ambulatory Visit: Payer: BLUE CROSS/BLUE SHIELD | Admitting: Cardiology

## 2017-06-03 ENCOUNTER — Encounter: Payer: Self-pay | Admitting: Cardiology

## 2017-06-03 VITALS — BP 142/89 | HR 73 | Ht 72.0 in | Wt 232.4 lb

## 2017-06-03 DIAGNOSIS — I10 Essential (primary) hypertension: Secondary | ICD-10-CM

## 2017-06-03 DIAGNOSIS — E669 Obesity, unspecified: Secondary | ICD-10-CM | POA: Diagnosis not present

## 2017-06-03 DIAGNOSIS — I4821 Permanent atrial fibrillation: Secondary | ICD-10-CM

## 2017-06-03 DIAGNOSIS — I2510 Atherosclerotic heart disease of native coronary artery without angina pectoris: Secondary | ICD-10-CM | POA: Diagnosis not present

## 2017-06-03 DIAGNOSIS — E785 Hyperlipidemia, unspecified: Secondary | ICD-10-CM

## 2017-06-03 DIAGNOSIS — E1169 Type 2 diabetes mellitus with other specified complication: Secondary | ICD-10-CM

## 2017-06-03 DIAGNOSIS — I482 Chronic atrial fibrillation: Secondary | ICD-10-CM | POA: Diagnosis not present

## 2017-06-03 NOTE — Patient Instructions (Signed)
NO CHANGE WITH MEDICATION CHANGES AT PRESENT     Your physician wants you to follow-up in 6 MONTH WITH DR HARDING. You will receive a reminder letter in the mail two months in advance. If you don't receive a letter, please call our office to schedule the follow-up appointment.    If you need a refill on your cardiac medications before your next appointment, please call your pharmacy.

## 2017-06-03 NOTE — Progress Notes (Signed)
PCP: Chris Bott, MD  Clinic Note: Chief Complaint  Patient presents with  . Follow-up    pt denies chest pain/discomfort, SOB. pt does mention swelling in legs  . Coronary Artery Disease    RCA CTO, no angina  . Atrial Fibrillation    Asymptomatic, permanent    HPI: Chris Wilcox is a 62 y.o. male with a PMH below who presents today for ~ 5-monthfollow-up for CAD and essentially permanent A. fib. He has history of known RCA occlusion but with no angina symptoms. He has had long-standing persistent/permanent A. Fib - rate controlled with diltiazem and metoprolol. Anticoagulated with Xarelto.  Chris SFarverwas last seen in Sept 2018 -doing well, stable  Recent Hospitalizations: none  Studies Personally Reviewed - (if available, images/films reviewed: From Epic Chart or Care Everywhere)  none  Interval History: Chris Wilcox returns for routine follow-up today overall doing pretty fairly well.  He has not had any further episodes where he felt his heart rate going fast.  No sense whatsoever being in A. fib or not.  His only major issue over the last 6 months has been try to get his glycemic control regulated.  He is now on an injectable medication which seems to be working better but he has been having some hypoglycemic certifications elevation failure. From a cardiac standpoint he denies any chest tightness or pressure with rest or exertion.  No PND, orthopnea with mild trace ankle edema at his socks.  No syncope/near syncope or TIA/amaurosis fugax. No melena, hematochezia, hematuria or epistaxis.  He has occasional dizzy spells from hypoglycemia ever since starting Ozempic for diabetes. . No claudication.  ROS: A comprehensive was performed. Review of Systems  Constitutional: Negative for chills, fever, malaise/fatigue and weight loss.  HENT: Negative for nosebleeds.   Respiratory: Negative for cough and shortness of breath.   Cardiovascular: Negative for claudication.  Gastrointestinal:  Negative for abdominal pain, blood in stool and heartburn.  Genitourinary: Negative for hematuria.  Musculoskeletal: Positive for back pain and joint pain (ankles & R (natural) knee).  Neurological: Negative.  Negative for focal weakness.  All other systems reviewed and are negative.  I have reviewed an his she is an 89month believe the office 20 on 2 usually uses TPN what do all my own computer hours ago I was going to [email or web address] d (if needed) personally updated the patient's problem list, medications, allergies, past medical and surgical history, social and family history.   Past Medical History:  Diagnosis Date  . Arthritis   . Complication of anesthesia    slow to awaken after last knee arthroscopy  . Coronary artery disease, occlusive 2008   CATH -- CTO OF RCA - unable to cross for PCI; L-R collateralization.   . Hyperlipidemia    on statin montiored by PCP  . Hypertension    well controlled  . [place]  . Obesity   . PAF (paroxysmal atrial fibrillation) ([facility]/08/2011-04/11/2011   ATRIAL FIB wore Event MONITOR;  with CHADS/VASC score of 2 on anticoagulation  . Pre-diabetes    checks cbg in am  qday    Past Surgical History:  Procedure Laterality Date  . ARTHROSCOPY KNEE W/ DRILLING Left yrs ago   x 2  . CARDIAC CATHETERIZATION  01/20/2007   unsuccessful PCI of RCA  . DOPPLER ECHOCARDIOGRAPHY  06/10/2011   EF =>55% -ATRIAL FIB,BORDERLINE BILEAFLET MITRAL VALVE PROLAPSE,MILD TO MOD MITARL INSUFF;MILD AORTIC INSUFF  . index finger surgery  Left 2009  . NM MYOCAR PERF WALL MOTION  03/13/2011   LEXISCAN--EF 55% LV MILDLY REDUCED;BASAL INFERIOR AND MID INFERIOR SCAR  . TOTAL KNEE ARTHROPLASTY Left 12/13/2012   Procedure: LEFT TOTAL KNEE ARTHROPLASTY;  Surgeon: Gearlean Alf, MD;  Location: [facility];  Service: Orthopedics;  Laterality: Left;    Current Meds  Medication Sig  . Acetaminophen (TYLENOL EXTRA STRENGTH PO) Take 1 tablet by mouth  as needed (takes 1-2 as needed, usually in the afternoon.).  Marland Kitchen aspirin EC 81 MG tablet Take 81 mg by mouth every other day.   Marland Kitchen atorvastatin (LIPITOR) 40 MG tablet TAKE 1 TABLET BY MOUTH EVERY EVENING.  Marland Kitchen diltiazem (CARDIZEM CD) 240 MG 24 hr capsule TAKE ONE CAPSULE BY MOUTH EVERY MORNING  . ezetimibe (ZETIA) 10 MG tablet TAKE 1 TABLET BY MOUTH EVERY DAY  . GLIPIZIDE PO Take 1 tablet by mouth daily.  . methocarbamol (ROBAXIN) 500 MG tablet Take 1 tablet (500 mg total) by mouth every 6 (six) hours as needed for muscle spasms.  . metoprolol tartrate (LOPRESSOR) 50 MG tablet TAKE 1/2 TABLET BY MOUTH TWICE DAILY  . nitroGLYCERIN (NITROSTAT) 0.4 MG SL tablet Place 0.4 mg under the tongue every 5 (five) minutes as needed for chest pain.  Marland Kitchen omega-3 fish oil (MAXEPA) 1000 MG CAPS capsule Take 3 capsules by mouth daily.  . Semaglutide (OZEMPIC) 0.25 or 0.5 MG/DOSE SOPN Inject 0.25 mg into the skin once a week.  . traMADol (ULTRAM) 50 MG tablet Take 1-2 tablets (50-100 mg total) by mouth every 6 (six) hours as needed (mild pain).  Alveda Reasons 20 MG TABS tablet TAKE 1 TABLET BY MOUTH DAILY WITH SUPPER.  . [DISCONTINUED] losartan (COZAAR) 50 MG tablet TAKE 1 TABLET BY MOUTH AT BEDTIME    Allergies  Allergen Reactions  . Codeine Nausea And Vomiting    GI upset  . Hydrocodone Nausea Only    Social History   Tobacco Use  . Smoking status: Never Smoker  . Smokeless tobacco: Never Used  Substance Use Topics  . Alcohol use: No  . Drug use: No   Social History   Social History Narrative   Married father of 2. He runs a Scientist, clinical (histocompatibility and immunogenetics).   He is back toexercising at least 15-20 minutes a day 3-4 times a week.  He is able to ride the bicycle for about 10-15 minutes and then needs to stop.   He denies any smoking or drinking history.     family history includes Cancer in his paternal grandmother; Diabetes in his father, maternal grandmother, and mother; Stroke in his maternal grandfather.  Wt  Readings from Last 3 Encounters:  06/03/17 232 lb 6.4 oz (105.4 kg)  10/31/16 238 lb (108 kg)  04/23/16 240 lb (108.9 kg)    PHYSICAL EXAM BP (!) 142/89 (BP Location: Left Arm)   Pulse 73   Ht 6' (1.829 m)   Wt 232 lb 6.4 oz (105.4 kg)   BMI 31.52 kg/m  Physical Exam  Constitutional: He is oriented to person, place, and time. He appears well-developed and well-nourished. No distress.  Well-groomed. Healthy-appearing  HENT:  Head: Normocephalic and atraumatic.  Neck: Normal range of motion. Neck supple. No hepatojugular reflux and no JVD present. Carotid bruit is not present.  Cardiovascular: Normal rate, normal heart sounds and intact distal pulses. An irregularly irregular rhythm present. PMI is not displaced. Exam reveals no gallop and no friction rub.  No murmur heard. Pulmonary/Chest: Effort normal and breath  sounds normal. No respiratory distress. He has no wheezes. He has no rales.  Abdominal: Soft. Bowel sounds are normal. He exhibits no distension. There is no tenderness. There is no rebound.  Musculoskeletal: Normal range of motion. Edema: trace-1+ bilateral ankle swelling at the socks.  Neurological: He is alert and oriented to person, place, and time.  Skin: Skin is warm and dry.  Psychiatric: He has a normal mood and affect. His behavior is normal. Judgment and thought content normal.  Nursing note and vitals reviewed.   Adult ECG Report  Rate: 73;  Rhythm: atrial fibrillation and normal response.  Non-specific ST-T changes; otherwise normal axis, intervals and durations.  Narrative Interpretation: stable EKG   Other studies Reviewed: Additional studies/ records that were reviewed today include:  Recent Labs:  Followed by PCP  Recent Labs:  Dec 2018 - Care Everywhere  TC 111, TG 49, HDL 41, LDL 60.  Sodium 137, potassium 4.6, chloride 104, bicarb 26, BUN/creatinine 19/1.0.  Glucose 199, calcium 9.0.  AST 27, ALT 35, alk phos 127.   ASSESSMENT / PLAN: Problem  List Items Addressed This Visit    Permanent atrial fibrillation [facility]): CHA2DS2-VASc Score 3 (CAD, DM-2 HTN) - Primary (Chronic)    Asymptomatic permanent A. fib.  He only really notes irregular heartbeats when his heart is going fast with exertion.  But no angina symptoms associated with it.  He is on Xarelto for anticoagulation no bleeding.  He is on a combination of beta-blocker and calcium channel blocker for rate control.  He has been instructed to take additional dose of metoprolol for breakthrough tachycardia.      Relevant Orders   EKG 12-Lead (Completed)   Obesity (BMI 30-39.9) (Chronic)    He is actually doing fairly well.  About 8 pound weight loss in the last year or so.  He is trying to work on being more active and watching his diet, especially with him now being diagnosed with diabetes and being on more medications.      Relevant Medications   Semaglutide (OZEMPIC) 0.25 or 0.5 MG/DOSE SOPN   Hyperlipidemia associated with type 2 diabetes mellitus (HCC) (Chronic)    Remains on atorvastatin, Zetia and fish oil.  Last lab check was in December and he was pretty much at goal.  Would continue current meds.  Hopefully as he continues to adjust his diet for diabetes, this will also improve his lipids.      Relevant Medications   Semaglutide (OZEMPIC) 0.25 or 0.5 MG/DOSE SOPN   Essential hypertension (Chronic)    Borderline blood pressure today on diltiazem and metoprolol.  Along with losartan.  He says his pressures at home are much better they are today.  We will continue to monitor but low threshold to titrate up losartan      Coronary artery disease, occlusive - RCA Occlusion, previously unable to cross occlusion. (Chronic)    Known stable single vessel disease with occluded RCA filling via collaterals.  No recurrent anginal symptoms or heart failure symptoms.  Continue  Continue current meds: Aspirin, statin, beta-blocker and ARB.      Relevant Orders   EKG 12-Lead  (Completed)     Current medicines are reviewed at length with the patient today. (+/- concerns) none The following changes have been made: if he has a prolonged episode of fast heart rates, he should take an additional half dose of metoprolol.  Patient Instructions  NO CHANGE WITH MEDICATION CHANGES AT PRESENT  Your physician wants you to follow-up in [place]. You will receive a reminder letter in the mail two months in advance. If you don't receive a letter, please call our office to schedule the follow-up appointment.    If you need a refill on your cardiac medications before your next appointment, please call your pharmacy.    Studies Ordered:   Orders Placed This Encounter  Procedures  . EKG 12-Lead      Glenetta Hew, M.D., M.S. Interventional Cardiologist   Pager # [phone] Phone # [phone] [address] [place], [place] [address]

## 2017-06-05 ENCOUNTER — Other Ambulatory Visit: Payer: Self-pay | Admitting: Cardiology

## 2017-06-05 ENCOUNTER — Encounter: Payer: Self-pay | Admitting: Cardiology

## 2017-06-05 NOTE — Assessment & Plan Note (Signed)
Asymptomatic permanent A. fib.  He only really notes irregular heartbeats when his heart is going fast with exertion.  But no angina symptoms associated with it.  He is on Xarelto for anticoagulation no bleeding.  He is on a combination of beta-blocker and calcium channel blocker for rate control.  He has been instructed to take additional dose of metoprolol for breakthrough tachycardia.

## 2017-06-05 NOTE — Assessment & Plan Note (Signed)
Remains on atorvastatin, Zetia and fish oil.  Last lab check was in December and he was pretty much at goal.  Would continue current meds.  Hopefully as he continues to adjust his diet for diabetes, this will also improve his lipids.

## 2017-06-05 NOTE — Assessment & Plan Note (Signed)
He is actually doing fairly well.  About 8 pound weight loss in the last year or so.  He is trying to work on being more active and watching his diet, especially with him now being diagnosed with diabetes and being on more medications.

## 2017-06-05 NOTE — Assessment & Plan Note (Signed)
Known stable single vessel disease with occluded RCA filling via collaterals.  No recurrent anginal symptoms or heart failure symptoms.  Continue  Continue current meds: Aspirin, statin, beta-blocker and ARB.

## 2017-06-05 NOTE — Assessment & Plan Note (Signed)
Borderline blood pressure today on diltiazem and metoprolol.  Along with losartan.  He says his pressures at home are much better they are today.  We will continue to monitor but low threshold to titrate up losartan

## 2017-08-16 ENCOUNTER — Other Ambulatory Visit: Payer: Self-pay | Admitting: Cardiology

## 2017-10-23 ENCOUNTER — Other Ambulatory Visit: Payer: Self-pay | Admitting: Cardiology

## 2017-11-13 ENCOUNTER — Other Ambulatory Visit: Payer: Self-pay | Admitting: Cardiology

## 2017-11-15 ENCOUNTER — Other Ambulatory Visit: Payer: Self-pay | Admitting: Cardiology

## 2017-11-23 ENCOUNTER — Other Ambulatory Visit: Payer: Self-pay | Admitting: Cardiology

## 2017-12-03 ENCOUNTER — Ambulatory Visit: Payer: BLUE CROSS/BLUE SHIELD | Admitting: Cardiology

## 2017-12-03 ENCOUNTER — Encounter: Payer: Self-pay | Admitting: Cardiology

## 2017-12-03 VITALS — BP 150/90 | HR 74 | Ht 72.0 in | Wt 230.8 lb

## 2017-12-03 DIAGNOSIS — I2510 Atherosclerotic heart disease of native coronary artery without angina pectoris: Secondary | ICD-10-CM | POA: Diagnosis not present

## 2017-12-03 DIAGNOSIS — I4821 Permanent atrial fibrillation: Secondary | ICD-10-CM | POA: Diagnosis not present

## 2017-12-03 DIAGNOSIS — E1169 Type 2 diabetes mellitus with other specified complication: Secondary | ICD-10-CM

## 2017-12-03 DIAGNOSIS — I10 Essential (primary) hypertension: Secondary | ICD-10-CM

## 2017-12-03 DIAGNOSIS — E785 Hyperlipidemia, unspecified: Secondary | ICD-10-CM

## 2017-12-03 MED ORDER — LOSARTAN POTASSIUM 100 MG PO TABS: 100.0000 mg | ORAL_TABLET | Freq: Every day | ORAL | 3 refills | Status: DC

## 2017-12-03 NOTE — Assessment & Plan Note (Signed)
Known single-vessel disease with collateralization.  No active anginal symptoms.  Tolerating well. Continue beta-blocker and statin. Currently on aspirin, we talked about potentially stopping it, but he feels more comfortable being on despite being on Xarelto.  He knows to hold if he has bruising. On statin.Marland Kitchen

## 2017-12-03 NOTE — Assessment & Plan Note (Signed)
Borderline elevated last higher than it was before.  Plan: Increase losartan to 100 mg  continue current dose diltiazem metoprolol

## 2017-12-03 NOTE — Assessment & Plan Note (Signed)
Continue current dose of statin.  Labs well-controlled.

## 2017-12-03 NOTE — Progress Notes (Signed)
PCP: Raelene Bott, MD  (on [place])  Clinic Note: Chief Complaint  Patient presents with  . Follow-up    54-month   HPI: RParrish Bonnis a 62y.o. male with a PMH notable for single vessel CAD & permanent Afib presents today for six-month follow-up.  Known CAD: RCA CTO with no angina symptoms.  He has had long-standing persistent/permanent A. Fib - rate controlled with diltiazem and metoprolol. Anticoagulated with Xarelto.  Tully SSpraggwas last seen in May 2019 -doing well, stable. No Afib RVR spells.  Recent Hospitalizations: none  Studies Personally Reviewed - (if available, images/films reviewed: From Epic Chart or Care Everywhere)  none  Interval History: Srihith returns today doing quite well.  He has no major complaints.  Occasional end of day swelling that is, mild and goes down when he puts his feet up.  Otherwise no PND orthopnea.  He has no real sensation of irregular heartbeats or palpitations.  No sense of tachycardia. No chest tightness pressure with rest or exertion.  No exertional dyspnea unless he overdoes it.  He has been working hard on try to get his glucose levels He denies any myalgias or arthralgias.  No syncope/near syncope, or TIA/amorous fugax.  No headaches or blurred vision. No claudication.  ROS: A comprehensive was performed. Review of Systems  Constitutional: Negative for chills, fever, malaise/fatigue and weight loss.  HENT: Negative for nosebleeds.   Respiratory: Negative for cough and shortness of breath.   Cardiovascular: Negative for claudication.  Gastrointestinal: Negative for abdominal pain, blood in stool and heartburn.  Genitourinary: Negative for hematuria.  Musculoskeletal: Positive for back pain and joint pain (ankles & R (natural) knee).  Neurological: Negative.  Negative for focal weakness.  Psychiatric/Behavioral: Negative for memory loss. The patient is not nervous/anxious.   All other systems reviewed and are  negative.  I [place]  Past Medical History:  Diagnosis Date  . Arthritis   . Complication of anesthesia    slow to awaken after last knee arthroscopy  . Coronary artery disease, occlusive 2008   CATH -- CTO OF RCA - unable to cross for PCI; L-R collateralization.   . Hyperlipidemia    on statin montiored by PCP  . Hypertension    well controlled  . [place]ocardial [place]  . Obesity   . PAF (paroxysmal atrial fibrillation) ([place] 03[place]   ATRIAL FIB wore Event MONITOR;  with CHADS/VASC score of 2 on anticoagulation  . Pre-diabetes    checks cbg in am  qday    Past Surgical History:  Procedure Laterality Date  . ARTHROSCOPY KNEE W/ DRILLING Left yrs ago   x 2  . CARDIAC CATHETERIZATION  01/20/2007   unsuccessful PCI of RCA  . DOPPLER ECHOCARDIOGRAPHY  06/10/2011   EF =>55% -ATRIAL FIB,BORDERLINE BILEAFLET MITRAL VALVE PROLAPSE,MILD TO MOD MITARL INSUFF;MILD AORTIC INSUFF  . index finger surgery Left 2009  . NM MYOCAR PERF WALL MOTION  03/13/2011   LEXISCAN--EF 55% LV MILDLY REDUCED;BASAL INFERIOR AND MID INFERIOR SCAR  . TOTAL KNEE ARTHROPLASTY Left 12/13/2012   Procedure: LEFT TOTAL KNEE ARTHROPLASTY;  Surgeon: FGearlean Alf MD;  Location: [facility];  Service: Orthopedics;  Laterality: Left;    Current Meds  Medication Sig  . Acetaminophen (TYLENOL EXTRA STRENGTH PO) Take 1 tablet by mouth as needed (takes 1-2 as needed, usually in the afternoon.).  .Marland Kitchenaspirin EC 81 MG tablet Take  81 mg by mouth every other day.   Marland Kitchen atorvastatin (LIPITOR) 40 MG tablet TAKE 1 TABLET BY MOUTH EVERY EVENING.  Marland Kitchen diltiazem (CARDIZEM CD) 240 MG 24 hr capsule TAKE ONE CAPSULE BY MOUTH EVERY MORNING  . ezetimibe (ZETIA) 10 MG tablet TAKE 1 TABLET BY MOUTH EVERY DAY  . GLIPIZIDE PO Take 1 tablet by mouth daily.  . methocarbamol (ROBAXIN) 500 MG tablet Take 1 tablet (500 mg total) by mouth every 6 (six) hours  as needed for muscle spasms.  . metoprolol tartrate (LOPRESSOR) 50 MG tablet TAKE 1/2 TABLET BY MOUTH TWICE DAILY  . nitroGLYCERIN (NITROSTAT) 0.4 MG SL tablet Place 0.4 mg under the tongue every 5 (five) minutes as needed for chest pain.  Marland Kitchen omega-3 fish oil (MAXEPA) 1000 MG CAPS capsule Take 3 capsules by mouth daily.  . Semaglutide (OZEMPIC) 0.25 or 0.5 MG/DOSE SOPN Inject 0.5 mg into the skin once a week.   . traMADol (ULTRAM) 50 MG tablet Take 1-2 tablets (50-100 mg total) by mouth every 6 (six) hours as needed (mild pain).  Alveda Reasons 20 MG TABS tablet TAKE 1 TABLET BY MOUTH DAILY WITH SUPPER.  . [DISCONTINUED] losartan (COZAAR) 50 MG tablet TAKE 1 TABLET BY MOUTH AT BEDTIME    Allergies  Allergen Reactions  . Codeine Nausea And Vomiting    GI upset Other reaction(s): NAUSEA  . Hydrocodone Nausea Only    Social History   Tobacco Use  . Smoking status: Never Smoker  . Smokeless tobacco: Never Used  Substance Use Topics  . Alcohol use: No  . Drug use: No   Social History   Social History Narrative   Married father of 2. He runs a Scientist, clinical (histocompatibility and immunogenetics).   He is back toexercising at least 15-20 minutes a day 3-4 times a week.  He is able to ride the bicycle for about 10-15 minutes and then needs to stop.   He denies any smoking or drinking history.    family history includes Cancer in his paternal grandmother; Diabetes in his father, maternal grandmother, and mother; Stroke in his maternal grandfather.  Wt Readings from Last 3 Encounters:  12/03/17 230 lb 12.8 oz (104.7 kg)  06/03/17 232 lb 6.4 oz (105.4 kg)  10/31/16 238 lb (108 kg)    PHYSICAL EXAM BP (!) 150/90   Pulse 74   Ht 6' (1.829 m)   Wt 230 lb 12.8 oz (104.7 kg)   BMI 31.30 kg/m  Physical Exam  Constitutional: He is oriented to person, place, and time. He appears well-developed and well-nourished. No distress.  Well-groomed.  Healthy-appearing  HENT:  Head: Normocephalic and atraumatic.  Neck: Normal  range of motion. Neck supple. No hepatojugular reflux and no JVD present. Carotid bruit is not present.  Cardiovascular: Normal rate, normal heart sounds and intact distal pulses. An irregularly irregular rhythm present. PMI is not displaced. Exam reveals no gallop and no friction rub.  No murmur heard. Pulmonary/Chest: Effort normal and breath sounds normal. No respiratory distress. He has no wheezes. He has no rales.  Abdominal: Soft. Bowel sounds are normal. He exhibits no distension. There is no tenderness. There is no rebound.  Musculoskeletal: Normal range of motion. He exhibits edema (Mild 1+ bilateral ankle swelling at the socks).  Neurological: He is alert and oriented to person, place, and time.  Psychiatric: He has a normal mood and affect. His behavior is normal. Judgment and thought content normal.  Nursing note and vitals reviewed.   Adult ECG  Report  Rate: 73;  Rhythm: atrial fibrillation and normal response.  Non-specific ST-T changes; otherwise normal axis, intervals and durations.  Narrative Interpretation: stable EKG   Other studies Reviewed: Additional studies/ records that were reviewed today include:    Recent Labs:  Followed by PCP -August 13, 2017- Care Everywhere  TC 6, TG 61, HDL 38, LDL 86.  Sodium 136, potassium 4.2, chloride 104, bicarb 23, BUN/creatinine 23/1.0.  Glucose 190, calcium 8.9.  AST 27, ALT 36, alk phos 154.   ASSESSMENT / PLAN: Problem List Items Addressed This Visit    Coronary artery disease, occlusive - RCA Occlusion, previously unable to cross occlusion. (Chronic)    Known single-vessel disease with collateralization.  No active anginal symptoms.  Tolerating well. Continue beta-blocker and statin. Currently on aspirin, we talked about potentially stopping it, but he feels more comfortable being on despite being on Xarelto.  He knows to hold if he has bruising. On statin..       Relevant Medications   losartan (COZAAR) 100 MG tablet    Essential hypertension (Chronic)    Borderline elevated last higher than it was before.  Plan: Increase losartan to 100 mg  continue current dose diltiazem metoprolol      Relevant Medications   losartan (COZAAR) 100 MG tablet   Hyperlipidemia associated with type 2 diabetes mellitus (HCC) (Chronic)    Continue current dose of statin.  Labs well-controlled.      Relevant Medications   losartan (COZAAR) 100 MG tablet   Permanent atrial fibrillation ([place]): CHA2DS2-VASc Score 3 (CAD, DM-2 HTN) - Primary (Chronic)    Permanent A. fib.  Asymptomatic.  No sensation of tachycardia.  No bradycardia. On combination of diltiazem and metoprolol for rate control. For full anticoagulation.  No bleeding issues.      Relevant Medications   losartan (COZAAR) 100 MG tablet     Current medicines are reviewed at length with the patient today. (+/- concerns) none The following changes have been made: if he has a prolonged episode of fast heart rates, he should take an additional half dose of metoprolol.  Patient Instructions  Medication Instructions:   -- increase Losartan to 100 mg daily  If you need a refill on your cardiac medications before your next appointment, please call your pharmacy.   Lab work: Not needed If you have labs (blood work) drawn today and your tests are completely normal, you will receive your results only by: Marland Kitchen MyChart Message (if you have MyChart) OR . A paper copy in the mail If you have any lab test that is abnormal or we need to change your treatment, we will call you to review the results.  Testing/Procedures: Not needed  Follow-Up: At [facility] you and your health needs are our priority.  As part of our continuing mission to provide you with exceptional heart care, we have created designated Provider Care Teams.  These Care Teams include your primary Cardiologist (physician) and Advanced Practice Providers (APPs -  Physician Assistants and Nurse  Practitioners) who all work together to provide you with the care you need, when you need it. You will need a follow up appointment in 6 months.  Please call our office 2 months in advance to schedule this appointment.  You may see Glenetta Hew, MD or one of the following Advanced Practice Providers on your designated Care Team:   Rosaria Ferries, PA-C . Jory Sims, DNP, ANP  Any Other Special Instructions Will Be Listed Below (If  Applicable).    Studies Ordered:   No orders of the defined types were placed in this encounter.     Glenetta Hew, M.D., M.S. Interventional Cardiologist   Pager # [phone] Phone # [phone] [place], [place] [address]

## 2017-12-03 NOTE — Patient Instructions (Signed)
Medication Instructions:   -- increase Losartan to 100 mg daily  If you need a refill on your cardiac medications before your next appointment, please call your pharmacy.   Lab work: Not needed If you have labs (blood work) drawn today and your tests are completely normal, you will receive your results only by: Marland Kitchen MyChart Message (if you have MyChart) OR . A paper copy in the mail If you have any lab test that is abnormal or we need to change your treatment, we will call you to review the results.  Testing/Procedures: Not needed  Follow-Up: At [facility], you and your health needs are our priority.  As part of our continuing mission to provide you with exceptional heart care, we have created designated Provider Care Teams.  These Care Teams include your primary Cardiologist (physician) and Advanced Practice Providers (APPs -  Physician Assistants and Nurse Practitioners) who all work together to provide you with the care you need, when you need it. You will need a follow up appointment in 6 months.  Please call our office 2 months in advance to schedule this appointment.  You may see Bryan Lemma, MD or one of the following Advanced Practice Providers on your designated Care Team:   Theodore Demark, PA-C . Joni Reining, DNP, ANP  Any Other Special Instructions Will Be Listed Below (If Applicable).

## 2017-12-03 NOTE — Assessment & Plan Note (Signed)
Permanent A. fib.  Asymptomatic.  No sensation of tachycardia.  No bradycardia. On combination of diltiazem and metoprolol for rate control. For full anticoagulation.  No bleeding issues.

## 2018-01-14 ENCOUNTER — Other Ambulatory Visit: Payer: Self-pay | Admitting: Cardiology

## 2018-05-13 ENCOUNTER — Other Ambulatory Visit: Payer: Self-pay | Admitting: Cardiology

## 2018-05-13 NOTE — Telephone Encounter (Signed)
64 yo, 230lbs, Scr 1.00 on 08/13/17 from Care everywhere, Crcl 121ml/min Last OV 12/03/17

## 2018-05-15 ENCOUNTER — Other Ambulatory Visit: Payer: Self-pay | Admitting: Cardiology

## 2018-05-17 NOTE — Telephone Encounter (Signed)
Rx(s) sent to pharmacy electronically.  

## 2018-05-19 ENCOUNTER — Other Ambulatory Visit: Payer: Self-pay | Admitting: Cardiology

## 2018-05-19 NOTE — Telephone Encounter (Signed)
Lopressor 50 mg refilled. 

## 2018-06-02 ENCOUNTER — Ambulatory Visit: Payer: BLUE CROSS/BLUE SHIELD | Admitting: Cardiology

## 2018-08-28 ENCOUNTER — Other Ambulatory Visit: Payer: Self-pay | Admitting: Cardiology

## 2018-10-21 ENCOUNTER — Other Ambulatory Visit: Payer: Self-pay | Admitting: Cardiology

## 2018-10-22 ENCOUNTER — Encounter: Payer: Self-pay | Admitting: Cardiology

## 2018-10-22 ENCOUNTER — Ambulatory Visit (INDEPENDENT_AMBULATORY_CARE_PROVIDER_SITE_OTHER): Payer: BLUE CROSS/BLUE SHIELD | Admitting: Cardiology

## 2018-10-22 ENCOUNTER — Other Ambulatory Visit: Payer: Self-pay

## 2018-10-22 VITALS — BP 166/103 | HR 73 | Temp 97.2°F | Ht 72.0 in | Wt 222.0 lb

## 2018-10-22 DIAGNOSIS — I2510 Atherosclerotic heart disease of native coronary artery without angina pectoris: Secondary | ICD-10-CM | POA: Diagnosis not present

## 2018-10-22 DIAGNOSIS — I4821 Permanent atrial fibrillation: Secondary | ICD-10-CM

## 2018-10-22 DIAGNOSIS — I10 Essential (primary) hypertension: Secondary | ICD-10-CM

## 2018-10-22 DIAGNOSIS — E785 Hyperlipidemia, unspecified: Secondary | ICD-10-CM

## 2018-10-22 DIAGNOSIS — E1169 Type 2 diabetes mellitus with other specified complication: Secondary | ICD-10-CM | POA: Diagnosis not present

## 2018-10-22 MED ORDER — HYDROCHLOROTHIAZIDE 25 MG PO TABS: 25.0000 mg | ORAL_TABLET | Freq: Every day | ORAL | 3 refills | Status: DC

## 2018-10-22 NOTE — Patient Instructions (Addendum)
Medication Instructions:  -start HYDROCHLOROTHIAZIDE ( HCTZ) 25 MG - TAKE ONE DAILY  If you need a refill on your cardiac medications before your next appointment, please call your pharmacy.   Lab work: Not needed   Testing/Procedures:   Follow-Up: At [facility], you and your health needs are our priority.  As part of our continuing mission to provide you with exceptional heart care, we have created designated Provider Care Teams.  These Care Teams include your primary Cardiologist (physician) and Advanced Practice Providers (APPs -  Physician Assistants and Nurse Practitioners) who all work together to provide you with the care you need, when you need it. . You will need a follow up appointment in  12 months-SEPT 2021.  Please call our office 2 months in advance to schedule this appointment.  You may see Glenetta Hew, MD or one of the following Advanced Practice Providers on your designated Care Team:   . Rosaria Ferries, PA-C . Jory Sims, DNP, ANP  Any Other Special Instructions Will Be Listed Below.

## 2018-10-22 NOTE — Progress Notes (Signed)
PCP: Lindwood QuaHoffman, Byron, MD  (on [facility]where)  Clinic Note: Chief Complaint  Patient presents with  . Follow-up    annual  . Atrial Fibrillation    permanent  . Coronary Artery Disease    RCA CTO    HPI: Chris Wilcox is a 63 y.o. male with a Cardiac h/o 1V CAD & permanent Afib who presents for annual f/u.   Known CAD: RCA CTO with no angina symptoms.  He has had long-standing persistent/permanent A. Fib - rate controlled with diltiazem and metoprolol. Anticoagulated with Xarelto.  Chris Wilcox was last seen in Oct 2019 -doing well, stable. No Afib RVR spells.  Recent Hospitalizations:   none  Studies Personally Reviewed - (if available, images/films reviewed: From Epic Chart or Care Everywhere)  none  Interval History: Chris Wilcox today doing pretty well.  He is still coaching softball.  With this he is doing a lot of exercise including walking and jogging.  He denies any chest pain or pressure with rest or exertion.  No exertional dyspnea.  Mild antedated the edema is still present but usually goes away when he puts his feet up.  He tells me his blood pressures are usually better than they are today, but it has been up a little bit higher than before.  No PND or orthopnea.  No headache or blurred vision.  Blood pressure at his PCPs office was 136/70 not that long ago.  Although he is in A. fib, he denies any sensation of rapid irregular heartbeats or palpitations.  No syncope/near syncope or TIA/amaurosis fugax.  No bleeding on anticoagulation.  No claudication.  ROS: A comprehensive was performed. Review of Systems  Constitutional: Negative for chills, fever, malaise/fatigue and weight loss.  HENT: Negative for nosebleeds.   Respiratory: Negative for cough and shortness of breath.   Cardiovascular: Negative for claudication.  Gastrointestinal: Negative for abdominal pain, blood in stool and heartburn.  Genitourinary: Negative for hematuria.  Musculoskeletal: Positive  for back pain and joint pain (ankles & R (natural) knee).  Neurological: Negative.  Negative for dizziness and focal weakness.  Psychiatric/Behavioral: Negative for memory loss. The patient is not nervous/anxious.   All other systems reviewed and are negative.  The patient does not have symptoms concerning for COVID-19 infection (fever, chills, cough, or new shortness of breath).  The patient is practicing social distancing.  I have personally reviewed the Epic & Care Everywhere charts for additional data/studies.  Past Medical History:  Diagnosis Date  . Arthritis   . Complication of anesthesia    slow to awaken after last knee arthroscopy  . Coronary artery disease, occlusive 2008   CATH -- CTO OF RCA - unable to cross for PCI; L-R collateralization.   . Hyperlipidemia    on statin montiored by PCP  . Hypertension    well controlled  . Myocardial infarction (HCC) 2008  . Obesity   . PAF (paroxysmal atrial fibrillation) (HCC) 03/2011   ATRIAL FIB wore Event MONITOR;  with CHADS/VASC score of 2 on anticoagulation  . Pre-diabetes    checks cbg in am  qday    Past Surgical History:  Procedure Laterality Date  . ARTHROSCOPY KNEE W/ DRILLING Left yrs ago   x 2  . CARDIAC CATHETERIZATION  01/20/2007   unsuccessful PCI of RCA  . DOPPLER ECHOCARDIOGRAPHY  06/10/2011   EF =>55% -ATRIAL FIB,BORDERLINE BILEAFLET MITRAL VALVE PROLAPSE,MILD TO MOD MITARL INSUFF;MILD AORTIC INSUFF  . index finger surgery Left 2009  .  NM MYOCAR PERF WALL MOTION  03/13/2011   LEXISCAN--EF 55% LV MILDLY REDUCED;BASAL INFERIOR AND MID INFERIOR SCAR  . TOTAL KNEE ARTHROPLASTY Left 12/13/2012   Procedure: LEFT TOTAL KNEE ARTHROPLASTY;  Surgeon: Loanne Drilling, MD;  Location: [facility];  Service: Orthopedics;  Laterality: Left;    Current Meds  Medication Sig  . Acetaminophen (TYLENOL EXTRA STRENGTH PO) Take 1 tablet by mouth as needed (takes 1-2 as needed, usually in the afternoon.).  Marland Kitchen aspirin EC 81 MG  tablet Take 81 mg by mouth every other day.   Marland Kitchen atorvastatin (LIPITOR) 40 MG tablet TAKE 1 TABLET BY MOUTH EVERY EVENING.  Marland Kitchen diltiazem (CARDIZEM CD) 240 MG 24 hr capsule TAKE ONE CAPSULE BY MOUTH EVERY MORNING  . ezetimibe (ZETIA) 10 MG tablet Take 1 tablet (10 mg total) by mouth daily.  Marland Kitchen GLIPIZIDE PO Take 1 tablet by mouth daily.  . methocarbamol (ROBAXIN) 500 MG tablet Take 1 tablet (500 mg total) by mouth every 6 (six) hours as needed for muscle spasms.  . metoprolol tartrate (LOPRESSOR) 50 MG tablet TAKE 1/2 TABLET BY MOUTH TWICE DAILY  . nitroGLYCERIN (NITROSTAT) 0.4 MG SL tablet Place 0.4 mg under the tongue every 5 (five) minutes as needed for chest pain.  Marland Kitchen omega-3 fish oil (MAXEPA) 1000 MG CAPS capsule Take 3 capsules by mouth daily.  . Semaglutide (OZEMPIC) 0.25 or 0.5 MG/DOSE SOPN Inject 0.5 mg into the skin once a week.   . traMADol (ULTRAM) 50 MG tablet Take 1-2 tablets (50-100 mg total) by mouth every 6 (six) hours as needed (mild pain).  Carlena Hurl 20 MG TABS tablet TAKE 1 TABLET BY MOUTH DAILY WITH SUPPER.    Allergies  Allergen Reactions  . Codeine Nausea And Vomiting    GI upset Other reaction(s): NAUSEA  . Hydrocodone Nausea Only    Social History   Tobacco Use  . Smoking status: Never Smoker  . Smokeless tobacco: Never Used  Substance Use Topics  . Alcohol use: No  . Drug use: No   Social History   Social History Narrative   Married father of 2. 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   He is back toexercising at least 15-20 minutes a day 3-4 times a week.  He is able to ride the bicycle for about 10-15 minutes and then needs to stop.   He denies any smoking or drinking history.    family history includes Cancer in his paternal grandmother; Diabetes in his father, maternal grandmother, and mother; Stroke in his maternal grandfather.  Wt Readings from Last 3 Encounters:  10/22/18 222 lb (100.7 kg)  12/03/17 230 lb 12.8 oz (104.7 kg)  06/03/17 232 lb 6.4 oz (105.4  kg)    PHYSICAL EXAM BP (!) 166/103   Pulse 73   Temp (!) 97.2 F (36.2 C)   Ht 6' (1.829 m)   Wt 222 lb (100.7 kg)   SpO2 98%   BMI 30.11 kg/m  Physical Exam  Constitutional: He is oriented to person, place, and time. He appears well-developed and well-nourished. No distress.  Well-groomed.  Healthy-appearing.    HENT:  Head: Normocephalic and atraumatic.  Neck: Normal range of motion. Neck supple. No hepatojugular reflux and no JVD present. Carotid bruit is not present.  Cardiovascular: Normal rate, normal heart sounds and intact distal pulses. An irregularly irregular rhythm present. PMI is not displaced. Exam reveals no gallop and no friction rub.  No murmur heard. Pulmonary/Chest: Effort normal and breath sounds normal. No  respiratory distress. He has no wheezes. He has no rales.  Abdominal: Soft. Bowel sounds are normal. He exhibits no distension. There is no abdominal tenderness. There is no rebound.  Musculoskeletal: Normal range of motion.        General: Edema (trivial ~1+ bilateral ankle swelling at the socks) present.  Neurological: He is alert and oriented to person, place, and time.  Psychiatric: He has a normal mood and affect. His behavior is normal. Judgment and thought content normal.  Nursing note and vitals reviewed.   Adult ECG Report  Rate: 68 ;  Rhythm: atrial fibrillation and normal response.  Non-specific ST-T changes; CRO septal MI, age undermined. Otherwise normal axis, intervals and durations.  Narrative Interpretation: stable EKG   Other studies Reviewed: Additional studies/ records that were reviewed today include:    Recent Labs: 07/2018  Na+ 136, K+ 4.4, Cl- 103, HCO3- 27 , BUN 18, Cr 1.1, Glu 145, Ca2+ 9.1   CBC: W 6.8, H/H 14.5/41.8, Plt 190  TC 103, TG 54, HDL 42, LDL 50    ASSESSMENT / PLAN: Problem List Items Addressed This Visit    Coronary artery disease, occlusive - RCA Occlusion, previously unable to cross occlusion. (Chronic)     Single-vessel disease.  Time there were unable to cannulate and cross the lesion in the RCA.  Has not had any angina.  Plan to continue with aspirin statin and beta-blocker as well as ARB.  Aggressive risk factor modification.  No need for revascularization with preserved EF.      Relevant Medications   hydrochlorothiazide (HYDRODIURIL) 25 MG tablet   Other Relevant Orders   EKG 12-Lead   Permanent atrial fibrillation ([place]): CHA2DS2-VASc Score 3 (CAD, DM-2 HTN) - Primary (Chronic)    Stable chronic permanent A. fib.  Asymptomatic.  Rate well controlled on combination of diltiazem and metoprolol.  On full anticoagulation with no bleeding issues.  Doing well with Xarelto.      Relevant Medications   hydrochlorothiazide (HYDRODIURIL) 25 MG tablet   Other Relevant Orders   EKG 12-Lead   Hyperlipidemia associated with type 2 diabetes mellitus ([place]) (Chronic)    Lipids were just checked by his PCP.  Well-controlled on current dose of statin.  No changes for now.  He is on 40 mg atorvastatin.  We can potentially reduce this dose to 40 mg in the future, but LDL of 50 is actually really our "bulls-eye" target      Essential hypertension (Chronic)    Blood pressure is up a little bit from what it used to be.  With him having some edema, I would add HCTZ to his combination of diltiazem metoprolol and losartan.  Really do not want to titrate up his rate control agents much more than they ought ER because he is had some fatigue in the past.  He will take losartan with HCTZ in the morning and diltiazem at night.      Relevant Medications   hydrochlorothiazide (HYDRODIURIL) 25 MG tablet     COVID-19 Education: The signs and symptoms of COVID-19 were discussed with the patient and how to seek care for testing (follow up with PCP or arrange E-visit).   The importance of social distancing was discussed today.   Current medicines are reviewed at length with the patient today. (+/- concerns) none  The following changes have been made: if he has a prolonged episode of fast heart rates, he should take an additional half dose of metoprolol.  Patient Instructions  Medication Instructions:  -start HYDROCHLOROTHIAZIDE ( HCTZ) 25 MG - TAKE ONE DAILY  If you need a refill on your cardiac medications before your next appointment, please call your pharmacy.   Lab work: Not needed   Testing/Procedures:   Follow-Up: At [facility], you and your health needs are our priority.  As part of our continuing mission to provide you with exceptional heart care, we have created designated Provider Care Teams.  These Care Teams include your primary Cardiologist (physician) and Advanced Practice Providers (APPs -  Physician Assistants and Nurse Practitioners) who all work together to provide you with the care you need, when you need it. . You will need a follow up appointment in  12 months-SEPT 2021.  Please call our office 2 months in advance to schedule this appointment.  You may see Bryan Lemmaavid Synthia Fairbank, MD or one of the following Advanced Practice Providers on your designated Care Team:   . Theodore DemarkRhonda Barrett, PA-C . Joni ReiningKathryn Lawrence, DNP, ANP  Any Other Special Instructions Will Be Listed Below.  Studies Ordered:   Orders Placed This Encounter  Procedures  . EKG 12-Lead      Bryan Lemmaavid Marketta Valadez, M.D., M.S. Interventional Cardiologist   Pager # [phone] Phone # [phone] [address] [place], [place] [address]

## 2018-10-24 ENCOUNTER — Encounter: Payer: Self-pay | Admitting: Cardiology

## 2018-10-24 NOTE — Assessment & Plan Note (Signed)
Lipids were just checked by his PCP.  Well-controlled on current dose of statin.  No changes for now.  He is on 40 mg atorvastatin.  We can potentially reduce this dose to 40 mg in the future, but LDL of 50 is actually really our "bulls-eye" target

## 2018-10-24 NOTE — Assessment & Plan Note (Signed)
Single-vessel disease.  Time there were unable to cannulate and cross the lesion in the RCA.  Has not had any angina.  Plan to continue with aspirin statin and beta-blocker as well as ARB.  Aggressive risk factor modification.  No need for revascularization with preserved EF.

## 2018-10-24 NOTE — Assessment & Plan Note (Signed)
Stable chronic permanent A. fib.  Asymptomatic.  Rate well controlled on combination of diltiazem and metoprolol.  On full anticoagulation with no bleeding issues.  Doing well with Xarelto.

## 2018-10-24 NOTE — Assessment & Plan Note (Signed)
Blood pressure is up a little bit from what it used to be.  With him having some edema, I would add HCTZ to his combination of diltiazem metoprolol and losartan.  Really do not want to titrate up his rate control agents much more than they ought ER because he is had some fatigue in the past.  He will take losartan with HCTZ in the morning and diltiazem at night.

## 2018-11-05 ENCOUNTER — Other Ambulatory Visit: Payer: Self-pay | Admitting: Cardiology

## 2018-11-07 ENCOUNTER — Other Ambulatory Visit: Payer: Self-pay | Admitting: Cardiology

## 2018-11-08 ENCOUNTER — Other Ambulatory Visit: Payer: Self-pay

## 2018-11-08 NOTE — Telephone Encounter (Signed)
Called pcp and they should be faxing over labs from [place] but I also called the pt and he didn't seem willing to come in to have a metabolic panel

## 2018-11-08 NOTE — Telephone Encounter (Signed)
This encounter was created in error - please disregard.

## 2018-11-17 ENCOUNTER — Other Ambulatory Visit: Payer: Self-pay | Admitting: Cardiology

## 2018-12-12 ENCOUNTER — Other Ambulatory Visit: Payer: Self-pay | Admitting: Cardiology

## 2019-02-11 ENCOUNTER — Other Ambulatory Visit: Payer: Self-pay | Admitting: Cardiology

## 2019-03-01 ENCOUNTER — Other Ambulatory Visit: Payer: Self-pay | Admitting: Cardiology

## 2019-03-02 ENCOUNTER — Other Ambulatory Visit: Payer: Self-pay

## 2019-03-02 DIAGNOSIS — I4821 Permanent atrial fibrillation: Secondary | ICD-10-CM

## 2019-04-17 ENCOUNTER — Other Ambulatory Visit: Payer: Self-pay | Admitting: Cardiology

## 2019-10-09 ENCOUNTER — Other Ambulatory Visit: Payer: Self-pay | Admitting: Cardiology

## 2019-10-25 ENCOUNTER — Other Ambulatory Visit: Payer: Self-pay | Admitting: Cardiology

## 2019-10-27 ENCOUNTER — Other Ambulatory Visit: Payer: Self-pay | Admitting: Cardiology

## 2019-10-31 ENCOUNTER — Encounter: Payer: Self-pay | Admitting: Cardiology

## 2019-10-31 ENCOUNTER — Other Ambulatory Visit: Payer: Self-pay

## 2019-10-31 ENCOUNTER — Ambulatory Visit: Payer: BC Managed Care – PPO | Admitting: Cardiology

## 2019-10-31 VITALS — BP 128/82 | HR 69 | Ht 71.0 in | Wt 211.4 lb

## 2019-10-31 DIAGNOSIS — I10 Essential (primary) hypertension: Secondary | ICD-10-CM | POA: Diagnosis not present

## 2019-10-31 DIAGNOSIS — I2510 Atherosclerotic heart disease of native coronary artery without angina pectoris: Secondary | ICD-10-CM

## 2019-10-31 DIAGNOSIS — E785 Hyperlipidemia, unspecified: Secondary | ICD-10-CM

## 2019-10-31 DIAGNOSIS — E1169 Type 2 diabetes mellitus with other specified complication: Secondary | ICD-10-CM | POA: Diagnosis not present

## 2019-10-31 DIAGNOSIS — E669 Obesity, unspecified: Secondary | ICD-10-CM

## 2019-10-31 DIAGNOSIS — I4821 Permanent atrial fibrillation: Secondary | ICD-10-CM

## 2019-10-31 NOTE — Patient Instructions (Addendum)
Medication Instructions:  Your physician recommends that you continue on your current medications as directed. Please refer to the Current Medication list given to you today.  *If you need a refill on your cardiac medications before your next appointment, please call your pharmacy*  Follow-Up: At [facility], you and your health needs are our priority.  As part of our continuing mission to provide you with exceptional heart care, we have created designated Provider Care Teams.  These Care Teams include your primary Cardiologist (physician) and Advanced Practice Providers (APPs -  Physician Assistants and Nurse Practitioners) who all work together to provide you with the care you need, when you need it.  We recommend signing up for the patient portal called "MyChart".  Sign up information is provided on this After Visit Summary.  MyChart is used to connect with patients for Virtual Visits (Telemedicine).  Patients are able to view lab/test results, encounter notes, upcoming appointments, etc.  Non-urgent messages can be sent to your provider as well.   To learn more about what you can do with MyChart, go to [email or web address].    Your next appointment:   12 month(s)  The format for your next appointment:   In Person  Provider:   David Harding, MD   

## 2019-10-31 NOTE — Progress Notes (Signed)
Primary Care Provider: Raelene Bott, MD Cardiologist: Glenetta Hew, MD Electrophysiologist: None  Clinic Note: Chief Complaint  Patient presents with  . Follow-up    Doing well  . Atrial Fibrillation    Under control   HPI:    Chris Wilcox is a 64 y.o. male with a PMH of notable for one-vessel CAD with permanent A. fib, and recurrent nephrolithiasis who presents today for annual follow-up.  Chris Wilcox was last seen on October 22, 2018-was doing relatively well.  Still coaching softball.  Moderate exercise walking anxiety.  No chest pain or pressure.  No dyspnea.  Mild end of day edema.  Recent Hospitalizations:   04/03/2019 - [facility] - Acute Cystitis w/ hematuria -started on Keflex--> Nephrolithiasis.  Reviewed  CV studies:    The following studies were reviewed today: (if available, images/films reviewed: From Epic Chart or Care Everywhere) . None:   Interval History:   Chris Wilcox returns today overall doing pretty well.  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  He also is pretty active jogging or walking.  Continues to lose weight little by little each year.  Definitely pain close attention to his diet and continue his exercise.  He continues to do well without any sensation of chest pain or pressure with exertion.  No recurrent symptoms of rapid heart rates although he does occasionally feel his heart skipping.  CV Review of Symptoms (Summary): no chest pain or dyspnea on exertion positive for - irregular heartbeat, palpitations and Occasional hematuria with nephrolithiasis or UTI. negative for - edema, orthopnea, paroxysmal nocturnal dyspnea, rapid heart rate, shortness of breath or Syncope/near syncope, TIA/amaurosis fugax, claudication.  Melena, hematochezia or epistaxis.  Rare hematuria.  The patient does not have symptoms concerning for COVID-19 infection (fever, chills, cough, or new shortness of breath).   REVIEWED OF SYSTEMS   Review of  Systems  Constitutional: Positive for weight loss (Intentional). Negative for malaise/fatigue.  HENT: Negative for congestion.   Respiratory: Negative for cough, shortness of breath and wheezing.   Gastrointestinal: Negative for abdominal pain and constipation.  Musculoskeletal: Positive for joint pain (Ankles hips and knees bother him just like any athlete.).  Neurological: Negative for dizziness and headaches.  Psychiatric/Behavioral: Negative for memory loss. The patient is not nervous/anxious and does not have insomnia.    Pertinent symptoms noted above.  I have reviewed and (if needed) personally updated the patient's problem list, medications, allergies, past medical and surgical history, social and family history.   PAST MEDICAL HISTORY   Past Medical History:  Diagnosis Date  . Arthritis   . Complication of anesthesia    slow to awaken after last knee arthroscopy  . Coronary artery disease, occlusive 2008   CATH -- CTO OF RCA - unable to cross for PCI; L-R collateralization.   . Hyperlipidemia    on statin montiored by PCP  . Hypertension    well controlled  . Myocardial infarction ([place]) 2008  . Obesity   . PAF (paroxysmal atrial fibrillation) ([place]) 03/2011   ATRIAL FIB wore Event MONITOR;  with CHADS/VASC score of 2 on anticoagulation  . Pre-diabetes    checks cbg in am  qday    PAST SURGICAL HISTORY   Past Surgical History:  Procedure Laterality Date  . ARTHROSCOPY KNEE W/ DRILLING Left yrs ago   x 2  . CARDIAC CATHETERIZATION  01/20/2007   unsuccessful PCI of RCA  . DOPPLER ECHOCARDIOGRAPHY  06/10/2011   EF =>  55% -ATRIAL FIB,BORDERLINE BILEAFLET MITRAL VALVE PROLAPSE,MILD TO MOD MITARL INSUFF;MILD AORTIC INSUFF  . index finger surgery Left 2009  . NM MYOCAR PERF WALL MOTION  03/13/2011   LEXISCAN--EF 55% LV MILDLY REDUCED;BASAL INFERIOR AND MID INFERIOR SCAR  . TOTAL KNEE ARTHROPLASTY Left 12/13/2012   Procedure: LEFT TOTAL KNEE ARTHROPLASTY;  Surgeon: Gearlean Alf, MD;  Location: [facility];  Service: Orthopedics;  Laterality: Left;     There is no immunization history on file for this patient.  MEDICATIONS/ALLERGIES   Current Meds  Medication Sig  . Acetaminophen (TYLENOL EXTRA STRENGTH PO) Take 1 tablet by mouth as needed (takes 1-2 as needed, usually in the afternoon.).  Marland Kitchen aspirin EC 81 MG tablet Take 81 mg by mouth every other day.   Marland Kitchen atorvastatin (LIPITOR) 40 MG tablet TAKE 1 TABLET BY MOUTH EVERY DAY IN THE EVENING  . diltiazem (CARDIZEM CD) 240 MG 24 hr capsule TAKE ONE CAPSULE BY MOUTH EVERY MORNING  . ezetimibe (ZETIA) 10 MG tablet Take 1 tablet (10 mg total) by mouth daily.  Marland Kitchen GLIPIZIDE PO Take 1 tablet by mouth daily.  . hydrochlorothiazide (HYDRODIURIL) 25 MG tablet TAKE 1 TABLET BY MOUTH EVERY DAY  . losartan (COZAAR) 100 MG tablet TAKE 1 TABLET BY MOUTH EVERY DAY  . metFORMIN (GLUCOPHAGE) 500 MG tablet Take 500 mg by mouth in the morning and at bedtime.  . methocarbamol (ROBAXIN) 500 MG tablet Take 1 tablet (500 mg total) by mouth every 6 (six) hours as needed for muscle spasms.  . metoprolol tartrate (LOPRESSOR) 50 MG tablet Take 0.5 tablets (25 mg total) by mouth 2 (two) times daily.  . nitroGLYCERIN (NITROSTAT) 0.4 MG SL tablet Place 0.4 mg under the tongue every 5 (five) minutes as needed for chest pain.  Marland Kitchen omega-3 fish oil (MAXEPA) 1000 MG CAPS capsule Take 3 capsules by mouth daily.  . Semaglutide (OZEMPIC) 0.25 or 0.5 MG/DOSE SOPN Inject 0.5 mg into the skin once a week.   . traMADol (ULTRAM) 50 MG tablet Take 1-2 tablets (50-100 mg total) by mouth every 6 (six) hours as needed (mild pain).  Alveda Reasons 20 MG TABS tablet TAKE 1 TABLET BY MOUTH DAILY WITH SUPPER    Allergies  Allergen Reactions  . Codeine Nausea And Vomiting    GI upset Other reaction(s): NAUSEA  . Hydrocodone Nausea Only    SOCIAL HISTORY/FAMILY HISTORY   Reviewed in Epic:  Pertinent findings: No new changes.  Still coaching softball  OBJCTIVE  -PE, EKG, labs   Wt Readings from Last 3 Encounters:  10/31/19 211 lb 6.4 oz (95.9 kg)  10/22/18 222 lb (100.7 kg)  12/03/17 230 lb 12.8 oz (104.7 kg)    Physical Exam: BP 128/82   Pulse 69   Ht $R'5\' 11"'Pr$  (1.803 m)   Wt 211 lb 6.4 oz (95.9 kg)   BMI 29.48 kg/m  Physical Exam Vitals reviewed.  Constitutional:      General: He is not in acute distress.    Appearance: Normal appearance. He is obese. He is not ill-appearing.     Comments: Borderline obese but otherwise healthy-appearing.  Well-groomed.  HENT:     Head: Normocephalic and atraumatic.  Neck:     Vascular: No carotid bruit, hepatojugular reflux or JVD.  Cardiovascular:     Rate and Rhythm: Normal rate. Rhythm irregularly irregular.     Chest Wall: PMI is not displaced.     Pulses: Intact distal pulses.     Heart sounds:  S1 normal and S2 normal. Heart sounds are distant. No murmur heard.  No friction rub. No gallop.   Pulmonary:     Effort: Pulmonary effort is normal. No respiratory distress.     Breath sounds: Normal breath sounds. No stridor.  Musculoskeletal:        General: Swelling (Trivial ankle) present. Normal range of motion.     Cervical back: Normal range of motion and neck supple.  Neurological:     General: No focal deficit present.     Mental Status: He is alert and oriented to person, place, and time. Mental status is at baseline.  Psychiatric:        Mood and Affect: Mood normal.        Behavior: Behavior normal.        Thought Content: Thought content normal.        Judgment: Judgment normal.     Adult ECG Report  Rate: 69;  Rhythm: atrial fibrillation and Normal S1-S2.  Q waves in V5 only otherwise cannot exclude septal MI.  Undetermined.;   Narrative Interpretation: Stable  Recent Labs: 10/12/2019  Ref Range & Units 9/82021   Triglycerides <150 mg/dL 60   Cholesterol 100 - 199 mg/dL 103   HDL >=40 mg/dL 33Low   LDL Calculated 60 - 99 mg/dL 58Low    Sodium 135 - 145 mmol/L 138     Potassium 3.5 - 5.0 mmol/L 4.0   Chloride 98 - 107 mmol/L 105   Anion Gap 7 - 15 mmol/L 6Low   CO2 22.0 - 32.0 mmol/L 27.0   BUN 7 - 21 mg/dL 18   Creatinine 0.70 - 1.30 mg/dL 1.10   EGFR CKD-EPI Non-African American, Male >=60 mL/min/1.70m2 72   Glucose 74 - 106 mg/dL 141High   Calcium 8.5 - 10.2 mg/dL 9.1   Albumin 3.4 - 5.0 g/dL 3.8   Total Protein 6.5 - 8.3 g/dL 5.8Low   Total Bilirubin 0.1 - 1.2 mg/dL 0.9   AST 19 - 55 U/L 26   ALT <50 U/L 32   Alkaline Phosphatase 38 - 126 U/L 108     ASSESSMENT/PLAN    Problem List Items Addressed This Visit    Coronary artery disease, occlusive - RCA Occlusion, previously unable to cross occlusion. - Primary (Chronic)    Single-vessel disease with occluded RCA.  No recurrent angina.  Remains on aspirin along with Xarelto.  Would probably be okay to stop aspirin if there is any bleeding issues.  Remains on combination of beta-blocker plus calcium channel blocker (diltiazem for rate control. Remains on ARB along with moderate dose atorvastatin.  No active anginal symptoms at this point.      Relevant Orders   EKG 12-Lead (Completed)   Permanent atrial fibrillation ([place]): CHA2DS2-VASc Score 3 (CAD, DM-2 HTN) (Chronic)    Seems like now persistent/near permanent A. fib.  It is rate controlled with diltiazem and metoprolol.  Relatively asymptomatic.  Tolerating Xarelto.  Low threshold to stop aspirin.       Relevant Orders   EKG 12-Lead (Completed)   Hyperlipidemia associated with type 2 diabetes mellitus ([facility]) (Chronic)    Most recent lipid panel looked excellent with 40 mg of atorvastatin.  LDL at goal.  Continue atorvastatin as long as he is not having untoward symptoms.      Relevant Medications   metFORMIN (GLUCOPHAGE) 500 MG tablet   Essential hypertension (Chronic)    Blood pressure actually is pretty well controlled.  Both edema and  blood pressure improved with the addition of HCTZ.  In addition to the diltiazem  to metoprolol use for rate control, he is also on losartan/HCTZ.  Continue current meds.      Obesity (BMI 30-39.9) (Chronic)    He now actually is below the threshold for obesity with a BMI in the 29 range.  I congratulated him on his efforts with dietary change and exercise.      Relevant Medications   metFORMIN (GLUCOPHAGE) 500 MG tablet       COVID-19 Education: The signs and symptoms of COVID-19 were discussed with the patient and how to seek care for testing (follow up with PCP or arrange E-visit).   The importance of social distancing and COVID-19 vaccination was discussed today. The patient is practicing social distancing & Masking.   I spent a total of 15minutes with the patient spent in direct patient consultation.  Additional time spent with chart review  / charting (studies, outside notes, etc): 10 Total Time: 32 min   Current medicines are reviewed at length with the patient today.  (+/- concerns) n/a  Notice: This dictation was prepared with Dragon dictation along with smaller phrase technology. Any transcriptional errors that result from this process are unintentional and may not be corrected upon review.  Patient Instructions / Medication Changes & Studies & Tests Ordered   Patient Instructions  Medication Instructions:  Your physician recommends that you continue on your current medications as directed. Please refer to the Current Medication list given to you today.  *If you need a refill on your cardiac medications before your next appointment, please call your pharmacy*   Follow-Up: At [facility], you and your health needs are our priority.  As part of our continuing mission to provide you with exceptional heart care, we have created designated Provider Care Teams.  These Care Teams include your primary Cardiologist (physician) and Advanced Practice Providers (APPs -  Physician Assistants and Nurse Practitioners) who all work together to provide you with  the care you need, when you need it.  We recommend signing up for the patient portal called "MyChart".  Sign up information is provided on this After Visit Summary.  MyChart is used to connect with patients for Virtual Visits (Telemedicine).  Patients are able to view lab/test results, encounter notes, upcoming appointments, etc.  Non-urgent messages can be sent to your provider as well.   To learn more about what you can do with MyChart, go to [email or web address].    Your next appointment:   12 month(s)  The format for your next appointment:   In Person  Provider:   Glenetta Hew, MD   Studies Ordered:   Orders Placed This Encounter  Procedures  . EKG 12-Lead     Glenetta Hew, M.D., M.S. Interventional Cardiologist   Pager # [phone] Phone # [phone] [address] [place], [place] [address]   Thank you for choosing [facility] at [facility]!!

## 2019-11-07 ENCOUNTER — Encounter: Payer: Self-pay | Admitting: Cardiology

## 2019-11-07 NOTE — Assessment & Plan Note (Signed)
Blood pressure actually is pretty well controlled.  Both edema and blood pressure improved with the addition of HCTZ.  In addition to the diltiazem to metoprolol use for rate control, he is also on losartan/HCTZ.  Continue current meds.

## 2019-11-07 NOTE — Assessment & Plan Note (Signed)
Single-vessel disease with occluded RCA.  No recurrent angina.  Remains on aspirin along with Xarelto.  Would probably be okay to stop aspirin if there is any bleeding issues.  Remains on combination of beta-blocker plus calcium channel blocker (diltiazem for rate control. Remains on ARB along with moderate dose atorvastatin.  No active anginal symptoms at this point.

## 2019-11-07 NOTE — Assessment & Plan Note (Signed)
He now actually is below the threshold for obesity with a BMI in the 29 range.  I congratulated him on his efforts with dietary change and exercise.

## 2019-11-07 NOTE — Assessment & Plan Note (Addendum)
Seems like now persistent/near permanent A. fib.  It is rate controlled with diltiazem and metoprolol.  Relatively asymptomatic.  Tolerating Xarelto.  Low threshold to stop aspirin.

## 2019-11-07 NOTE — Assessment & Plan Note (Signed)
Most recent lipid panel looked excellent with 40 mg of atorvastatin.  LDL at goal.  Continue atorvastatin as long as he is not having untoward symptoms.

## 2019-11-08 ENCOUNTER — Other Ambulatory Visit: Payer: Self-pay | Admitting: Cardiology

## 2019-12-08 ENCOUNTER — Other Ambulatory Visit: Payer: Self-pay | Admitting: Cardiology

## 2020-01-11 ENCOUNTER — Other Ambulatory Visit: Payer: Self-pay | Admitting: Cardiology

## 2020-01-16 ENCOUNTER — Other Ambulatory Visit: Payer: Self-pay | Admitting: Cardiology

## 2020-03-25 ENCOUNTER — Other Ambulatory Visit: Payer: Self-pay | Admitting: Cardiology

## 2020-04-11 ENCOUNTER — Other Ambulatory Visit: Payer: Self-pay | Admitting: Cardiology

## 2020-04-21 ENCOUNTER — Other Ambulatory Visit: Payer: Self-pay | Admitting: Cardiology

## 2020-05-24 ENCOUNTER — Other Ambulatory Visit: Payer: Self-pay | Admitting: Cardiology

## 2020-10-18 ENCOUNTER — Other Ambulatory Visit: Payer: Self-pay | Admitting: Cardiology

## 2020-10-18 NOTE — Telephone Encounter (Signed)
Prescription refill request for Xarelto received.  Indication:Afib  Last office visit: 10/31/19 Herbie [place])  Weight: 95.9kg Age: 65 Scr: 1.20 (07/26/20) CrCl: 85.47  Appropriate dose and refill sent to requested pharmacy.

## 2020-11-09 ENCOUNTER — Other Ambulatory Visit: Payer: Self-pay | Admitting: Cardiology

## 2020-11-21 ENCOUNTER — Other Ambulatory Visit: Payer: Self-pay | Admitting: Cardiology

## 2020-11-28 ENCOUNTER — Other Ambulatory Visit: Payer: Self-pay

## 2020-11-28 ENCOUNTER — Encounter: Payer: Self-pay | Admitting: Cardiology

## 2020-11-28 ENCOUNTER — Ambulatory Visit: Payer: BC Managed Care – PPO | Admitting: Cardiology

## 2020-11-28 VITALS — BP 130/80 | HR 67 | Ht 72.0 in | Wt 208.0 lb

## 2020-11-28 DIAGNOSIS — I10 Essential (primary) hypertension: Secondary | ICD-10-CM

## 2020-11-28 DIAGNOSIS — E1169 Type 2 diabetes mellitus with other specified complication: Secondary | ICD-10-CM | POA: Diagnosis not present

## 2020-11-28 DIAGNOSIS — E669 Obesity, unspecified: Secondary | ICD-10-CM

## 2020-11-28 DIAGNOSIS — E785 Hyperlipidemia, unspecified: Secondary | ICD-10-CM

## 2020-11-28 DIAGNOSIS — I2510 Atherosclerotic heart disease of native coronary artery without angina pectoris: Secondary | ICD-10-CM | POA: Diagnosis not present

## 2020-11-28 DIAGNOSIS — I4821 Permanent atrial fibrillation: Secondary | ICD-10-CM | POA: Diagnosis not present

## 2020-11-28 NOTE — Patient Instructions (Addendum)

## 2020-11-28 NOTE — Progress Notes (Signed)
Primary Care Provider: Lindwood Qua, MD Cardiologist: Bryan Lemma, MD Electrophysiologist: None  Clinic Note: Chief Complaint  Patient presents with   Follow-up    Annual   Coronary Artery Disease    No angina   Atrial Fibrillation    No symptoms    ===================================  ASSESSMENT/PLAN   Problem List Items Addressed This Visit       Cardiology Problems   Coronary artery disease, occlusive - RCA Occlusion, previously unable to cross occlusion. - Primary (Chronic)    Single-vessel occlusive disease occluded RCA years ago.  No recurrent angina.  He is on aspirin, statin, beta-blocker, ARB, calcium channel blocker with no recurrent symptoms. He is also on Xarelto.  Low threshold to discontinue aspirin.      Relevant Orders   EKG 12-Lead (Completed)   Permanent atrial fibrillation (HCC): CHA2DS2-VASc Score 3 (CAD, DM-2 HTN) (Chronic)    Essentially permanent A. fib.  Rate controlled with combination of diltiazem and metoprolol with moderate doses of each.  Pretty much asymptomatic.  No tachycardic spells.  He does not notice any difference. He still takes aspirin along with Xarelto.  Low threshold to stop aspirin if he has any bleeding or bruising.      Relevant Orders   EKG 12-Lead (Completed)   Hyperlipidemia associated with type 2 diabetes mellitus (HCC) (Chronic)    Lipid panel has an LDL of 66 which is gradually creeping up from last year.  Overall stable though on current dose of atorvastatin plus Zetia.  He is now on Ozempic which is really helping his weight loss, but his A1c is up as opposed to down.  He is taking semaglutide      Relevant Medications   OZEMPIC, 2 MG/DOSE, 8 MG/3ML SOPN   Essential hypertension (Chronic)    Blood pressure pretty well controlled currently on Lopressor 25 mg twice daily, diltiazem CD2 140 mg daily and losartan 50 mg with HCTZ 25 mg.  Blood pressures are stable.  No change.        Other   Obesity (BMI  30-39.9) (Chronic)    Remarkable job losing weight.  He is now below the obesity threshold.      Relevant Medications   OZEMPIC, 2 MG/DOSE, 8 MG/3ML SOPN   ===================================  HPI:    Chris Wilcox is a 65 y.o. male with a PMH notable for single-vessel CAD and permanent A. fib along with recurrent nephrolithiasis who presents today for annual follow-up.  Chris Wilcox was last seen on October 11, 2019.  Doing very well.  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  Very active with jogging and walking.  Trying to lose weight little but little by little.  Change his diet and increase his exercise.  No chest pain or pressure with exertion.  He did note occasional irregular heartbeats and palpitations.  Also occasional hematuria with nephrolithiasis or UTI.  Recent Hospitalizations: None  Reviewed  CV studies:    The following studies were reviewed today: (if available, images/films reviewed: From Epic Chart or Care Everywhere) None:   Interval History:   Chris Wilcox today for annual follow-up doing as well as ever.Marland Kitchen  He is still exercising.  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  He is lost more weight now since he started Ozempic.  His blood sugars have also improved. He does still feel occasional palpitations and he has  some mild end of day swelling at the end of the day.  This is usually if he is on his feet all day long.  No PND or orthopnea.  CV Review of Symptoms (Summary) Cardiovascular ROS: no chest pain or dyspnea on exertion positive for - palpitations and mild end of day edema. negative for - orthopnea, palpitations, paroxysmal nocturnal dyspnea, rapid heart rate, shortness of breath, or lightheadedness or dizziness, syncope/near syncope or TIA/amaurosis fugax claudication  REVIEWED OF SYSTEMS   Review of Systems  Constitutional:  Positive for weight loss  (Intentional-another 3 pounds from last year). Negative for malaise/fatigue.  HENT:  Negative for nosebleeds.   Respiratory:  Negative for cough and shortness of breath.   Cardiovascular:  Negative for chest pain and leg swelling.  Gastrointestinal:  Negative for blood in stool and melena.  Genitourinary:  Positive for hematuria (Only when he has nephrolithiasis.  No recurrent spells.). Negative for dysuria and urgency.  Musculoskeletal:  Positive for joint pain (Chronic ankle and hip/knee pain). Negative for myalgias.  Neurological:  Negative for dizziness and weakness.  Psychiatric/Behavioral: Negative.    All other systems reviewed and are negative.  I have reviewed and (if needed) personally updated the patient's problem list, medications, allergies, past medical and surgical history, social and family history.   PAST MEDICAL HISTORY   Past Medical History:  Diagnosis Date   Arthritis    Complication of anesthesia    slow to awaken after last knee arthroscopy   Coronary artery disease, occlusive 2008   CATH -- CTO OF RCA - unable to cross for PCI; L-R collateralization.    Hyperlipidemia    on statin montiored by PCP   Hypertension    well controlled   Myocardial infarction [facility]) 2008   Obesity    PAF (paroxysmal atrial fibrillation) ([place]) 03/2011   ATRIAL FIB wore Event MONITOR;  with CHADS/VASC score of 2 on anticoagulation   Pre-diabetes    checks cbg in am  qday    PAST SURGICAL HISTORY   Past Surgical History:  Procedure Laterality Date   ARTHROSCOPY KNEE W/ DRILLING Left yrs ago   x 2   CARDIAC CATHETERIZATION  01/20/2007   unsuccessful PCI of RCA   DOPPLER ECHOCARDIOGRAPHY  06/10/2011   EF =>55% -ATRIAL FIB,BORDERLINE BILEAFLET MITRAL VALVE PROLAPSE,MILD TO MOD MITARL INSUFF;MILD AORTIC INSUFF   index finger surgery Left 2009   NM MYOCAR PERF WALL MOTION  03/13/2011   LEXISCAN--EF 55% LV MILDLY REDUCED;BASAL INFERIOR AND MID INFERIOR SCAR   TOTAL KNEE  ARTHROPLASTY Left 12/13/2012   Procedure: LEFT TOTAL KNEE ARTHROPLASTY;  Surgeon: Gearlean Alf, MD;  Location: [facility];  Service: Orthopedics;  Laterality: Left;    There is no immunization history for the selected administration types on file for this patient.   MEDICATIONS/ALLERGIES   Current Meds  Medication Sig   Acetaminophen (TYLENOL EXTRA STRENGTH PO) Take 1 tablet by mouth as needed (takes 1-2 as needed, usually in the afternoon.).   aspirin EC 81 MG tablet Take 81 mg by mouth every other day.    atorvastatin (LIPITOR) 40 MG tablet TAKE 1 TABLET BY MOUTH EVERY DAY IN THE EVENING   diltiazem (CARDIZEM CD) 240 MG 24 hr capsule TAKE 1 CAPSULE BY MOUTH EVERY DAY IN THE MORNING   ezetimibe (ZETIA) 10 MG tablet TAKE 1 TABLET BY MOUTH EVERY DAY   GLIPIZIDE PO Take 1 tablet by mouth daily.   hydrochlorothiazide (HYDRODIURIL) 25 MG tablet TAKE 1 TABLET  BY MOUTH EVERY DAY   losartan (COZAAR) 100 MG tablet TAKE 1 TABLET BY MOUTH EVERY DAY   methocarbamol (ROBAXIN) 500 MG tablet Take 1 tablet (500 mg total) by mouth every 6 (six) hours as needed for muscle spasms.   metoprolol tartrate (LOPRESSOR) 50 MG tablet TAKE 1/2 TABLETS (25 MG TOTAL) BY MOUTH 2 (TWO) TIMES DAILY.   nitroGLYCERIN (NITROSTAT) 0.4 MG SL tablet Place 0.4 mg under the tongue every 5 (five) minutes as needed for chest pain.   omega-3 fish oil (MAXEPA) 1000 MG CAPS capsule Take 3 capsules by mouth daily.   OZEMPIC, 2 MG/DOSE, 8 MG/3ML SOPN Inject 2 mg into the skin once a week.   Semaglutide,0.25 or 0.5MG /DOS, 2 MG/1.5ML SOPN Inject 0.5 mg into the skin once a week.    traMADol (ULTRAM) 50 MG tablet Take 1-2 tablets (50-100 mg total) by mouth every 6 (six) hours as needed (mild pain).   XARELTO 20 MG TABS tablet TAKE 1 TABLET BY MOUTH DAILY WITH SUPPER    Allergies  Allergen Reactions   Codeine Nausea And Vomiting    GI upset Other reaction(s): NAUSEA   Hydrocodone Nausea Only    SOCIAL HISTORY/FAMILY HISTORY    Reviewed in Epic:  Pertinent findings:  Social History   Tobacco Use   Smoking status: Never   Smokeless tobacco: Never  Substance Use Topics   Alcohol use: No   Drug use: No   Social History   Social History Narrative   Married father of 2. He runs a Scientist, clinical (histocompatibility and immunogenetics).   He is back toexercising at least 15-20 minutes a day 3-4 times a week.  He is able to ride the bicycle for about 10-15 minutes and then needs to stop.   He denies any smoking or drinking history.    OBJCTIVE -PE, EKG, labs   Wt Readings from Last 3 Encounters:  11/28/20 208 lb (94.3 kg)  10/31/19 211 lb 6.4 oz (95.9 kg)  10/22/18 222 lb (100.7 kg)    Physical Exam: BP 130/80 (BP Location: Right Arm)   Pulse 67   Ht 6' (1.829 m)   Wt 208 lb (94.3 kg)   SpO2 98%   BMI 28.21 kg/m  Physical Exam Constitutional:      General: He is not in acute distress.    Appearance: Normal appearance. He is normal weight. He is not ill-appearing (Healthy-appearing) or toxic-appearing.     Comments: Well-nourished, well-groomed.  No longer obese.  HENT:     Head: Normocephalic and atraumatic.  Neck:     Vascular: No carotid bruit or JVD.  Cardiovascular:     Rate and Rhythm: Normal rate. Rhythm irregularly irregular. No extrasystoles are present.    Chest Wall: PMI is not displaced.     Pulses: Normal pulses.     Heart sounds: S1 normal and S2 normal. Heart sounds are distant. No murmur heard.   No friction rub. No gallop.  Pulmonary:     Effort: Pulmonary effort is normal. No respiratory distress.     Breath sounds: Normal breath sounds. No wheezing, rhonchi or rales.  Chest:     Chest wall: No tenderness.  Musculoskeletal:        General: Swelling (Trivial pedal edema.) present.     Cervical back: Normal range of motion and neck supple.  Skin:    General: Skin is warm and dry.  Neurological:     General: No focal deficit present.     Mental Status: He  is alert and oriented to person, place, and time.      Gait: Gait normal.  Psychiatric:        Mood and Affect: Mood normal.        Behavior: Behavior normal.        Thought Content: Thought content normal.        Judgment: Judgment normal.    Adult ECG Report  Rate: 67 ;  Rhythm: atrial fibrillation and septal MI, age-indeterminate.  Otherwise normal axis, intervals durations. ;   Narrative Interpretation: Stable  Recent Labs: Stable POCT glycosylated hemoglobin (Hb A1C) Component 10/31/20 07/26/20 04/19/20  Hemoglobin A1C 9.4 Abnormal  8.6 Abnormal  7.7 Abnormal    [facility] Related to Lipid Panel Component 07/26/20  04/19/20  10/12/19   Triglycerides 77 65 60  Cholesterol 118 108 103  HDL 37 Low  35 Low  33 Low   LDL Calculated 66  60  58 Low     Basic metabolic panel Component 15/94/58 04/19/20 01/19/20  Sodium 135 134 Low  137  Potassium 4.0 4.2 4.2  Chloride 98 102 101  CO2 32.0 28.0 29.0  Anion Gap 5 Low  4 Low  7  BUN 21 21 25  High   Creatinine 1.20 1.20 1.30  BUN/Creatinine Ratio 18 18 19   eGFR CKD-EPI (2021) Male 68  -- --  Glucose 240 High  189 High  155 High   Calcium 9.1 9.1 9.6   CBC w/ Differential Component 07/26/20 04/19/20 01/19/20  WBC 7.2 7.0 5.8  RBC 4.68 4.70 4.46  HGB 14.1 13.8 13.6  HCT 40.1 41.8 40.3  MCV 85.8 88.9 90.4  MCH 30.2 29.4 30.5  MCHC 35.1 33.0 33.7  RDW 14.4 13.5 13.6  MPV 7.2 8.3 Low  8.4 Low   Platelet 201 189 216   ==================================================  COVID-19 Education: The signs and symptoms of COVID-19 were discussed with the patient and how to seek care for testing (follow up with PCP or arrange E-visit).    I spent a total of 17 minutes with the patient spent in direct patient consultation.  Additional time spent with chart review  / charting (studies, outside notes, etc): 15 min Total Time: 32 min  Current medicines are reviewed at length with the patient today.  (+/- concerns) none  This visit occurred during the SARS-CoV-2 public health  emergency.  Safety protocols were in place, including screening questions prior to the visit, additional usage of staff PPE, and extensive cleaning of exam room while observing appropriate contact time as indicated for disinfecting solutions.  Notice: This dictation was prepared with Dragon dictation along with smart phrase technology. Any transcriptional errors that result from this process are unintentional and may not be corrected upon review.  Patient Instructions / Medication Changes & Studies & Tests Ordered   Patient Instructions  Medication Instructions:   No changes *If you need a refill on your cardiac medications before your next appointment, please call your pharmacy*   Lab Work: Not needed    Testing/Procedures:  Not needed  Follow-Up: At [facility], you and your health needs are our priority.  As part of our continuing mission to provide you with exceptional heart care, we have created designated Provider Care Teams.  These Care Teams include your primary Cardiologist (physician) and Advanced Practice Providers (APPs -  Physician Assistants and Nurse Practitioners) who all work together to provide you with the care you need, when you need it.  Your next appointment:   12 month(s)  The format for your next appointment:   In Person  Provider:   Glenetta Hew, MD     Studies Ordered:   Orders Placed This Encounter  Procedures   EKG 12-Lead     Glenetta Hew, M.D., M.S. Interventional Cardiologist   Pager # [phone] Phone # [phone] [address] [place], [place] [address]   Thank you for choosing Heartcare at [facility]!!

## 2020-12-23 ENCOUNTER — Encounter: Payer: Self-pay | Admitting: Cardiology

## 2020-12-23 NOTE — Assessment & Plan Note (Signed)
Essentially permanent A. fib.  Rate controlled with combination of diltiazem and metoprolol with moderate doses of each.  Pretty much asymptomatic.  No tachycardic spells.  He does not notice any difference. He still takes aspirin along with Xarelto.  Low threshold to stop aspirin if he has any bleeding or bruising.

## 2020-12-24 NOTE — Assessment & Plan Note (Signed)
Blood pressure pretty well controlled currently on Lopressor 25 mg twice daily, diltiazem CD2 140 mg daily and losartan 50 mg with HCTZ 25 mg.  Blood pressures are stable.  No change.

## 2020-12-24 NOTE — Assessment & Plan Note (Signed)
Remarkable job losing weight.  He is now below the obesity threshold.

## 2020-12-24 NOTE — Assessment & Plan Note (Signed)
Single-vessel occlusive disease occluded RCA years ago.  No recurrent angina.  He is on aspirin, statin, beta-blocker, ARB, calcium channel blocker with no recurrent symptoms. He is also on Xarelto.  Low threshold to discontinue aspirin.

## 2020-12-24 NOTE — Assessment & Plan Note (Signed)
Lipid panel has an LDL of 66 which is gradually creeping up from last year.  Overall stable though on current dose of atorvastatin plus Zetia.  He is now on Ozempic which is really helping his weight loss, but his A1c is up as opposed to down.  He is taking semaglutide

## 2021-01-06 ENCOUNTER — Other Ambulatory Visit: Payer: Self-pay | Admitting: Cardiology

## 2021-04-11 ENCOUNTER — Other Ambulatory Visit: Payer: Self-pay | Admitting: Cardiology

## 2021-04-11 NOTE — Telephone Encounter (Signed)
Prescription refill request for Xarelto received.  ?Indication:Afib ?Last office visit:10/22 ?Weight:94.3 kg ?Age:66 ?Scr:1.1 ?CrCl:90.49 ml/min ? ?Prescription refilled ? ?

## 2021-04-17 ENCOUNTER — Other Ambulatory Visit: Payer: Self-pay | Admitting: Cardiology

## 2021-06-20 ENCOUNTER — Other Ambulatory Visit: Payer: Self-pay | Admitting: Cardiology

## 2021-09-25 ENCOUNTER — Other Ambulatory Visit: Payer: Self-pay | Admitting: Cardiology

## 2021-11-17 ENCOUNTER — Other Ambulatory Visit: Payer: Self-pay | Admitting: Cardiology

## 2021-11-17 DIAGNOSIS — I4821 Permanent atrial fibrillation: Secondary | ICD-10-CM

## 2021-11-18 ENCOUNTER — Other Ambulatory Visit: Payer: Self-pay | Admitting: Cardiology

## 2021-11-18 NOTE — Telephone Encounter (Signed)
Xarelto 20mg  refill request received. Pt is 66 years old, weight-94.3kg, Crea-1.20 on 10/04/2021 via Care Everywhere, last seen by Dr. Ellyn Hack on 11/28/2020, Diagnosis-Afib, CrCl- 82.95 mL/min; Dose is appropriate based on dosing criteria. Will send in refill to requested pharmacy.

## 2021-11-22 ENCOUNTER — Other Ambulatory Visit: Payer: Self-pay | Admitting: Cardiology

## 2021-11-24 ENCOUNTER — Other Ambulatory Visit: Payer: Self-pay | Admitting: Cardiology

## 2021-11-24 DIAGNOSIS — I4821 Permanent atrial fibrillation: Secondary | ICD-10-CM

## 2021-12-09 ENCOUNTER — Encounter: Payer: Self-pay | Admitting: Cardiology

## 2021-12-09 ENCOUNTER — Ambulatory Visit: Payer: BC Managed Care – PPO | Attending: Cardiology | Admitting: Cardiology

## 2021-12-09 VITALS — BP 126/77 | HR 78 | Ht 71.0 in | Wt 203.0 lb

## 2021-12-09 DIAGNOSIS — E1169 Type 2 diabetes mellitus with other specified complication: Secondary | ICD-10-CM | POA: Diagnosis not present

## 2021-12-09 DIAGNOSIS — I2510 Atherosclerotic heart disease of native coronary artery without angina pectoris: Secondary | ICD-10-CM | POA: Diagnosis not present

## 2021-12-09 DIAGNOSIS — I4821 Permanent atrial fibrillation: Secondary | ICD-10-CM

## 2021-12-09 DIAGNOSIS — I10 Essential (primary) hypertension: Secondary | ICD-10-CM | POA: Diagnosis not present

## 2021-12-09 DIAGNOSIS — E669 Obesity, unspecified: Secondary | ICD-10-CM

## 2021-12-09 DIAGNOSIS — E785 Hyperlipidemia, unspecified: Secondary | ICD-10-CM

## 2021-12-09 NOTE — Progress Notes (Signed)
Primary Care Provider: Raelene Bott, [place] Cardiologist: Glenetta Hew, MD Electrophysiologist: None  Clinic Note: Chief Complaint  Patient presents with   Follow-up    Annual follow-up.  Doing well.  No major issues.  Continues to lose weight.  A1c coming down.  Lipids well controlled.  No sensation of A-fib.  No bleeding.   Atrial Fibrillation    Permanent A-fib.  Does not really notice it.  No bleeding issues.   ===================================  ASSESSMENT/PLAN   Problem List Items Addressed This Visit       Cardiology Problems   Coronary artery disease, occlusive - RCA Occlusion, previously unable to cross occlusion. (Chronic)    Single-vessel disease.  No active angina.  Has left-to-right collaterals.  Has not had angina since that noted.   Plan:  On combination of diltiazem and metoprolol for rate control and antianginal benefit.  Tolerating current doses.  No change. On high-dose losartan Continue with aspirin along with Xarelto as long as not having bleeding issues.  Okay to hold aspirin if he does.  He is taking every other day. Also on GLP-1 agonist and SGLT2-inhibitor along with metformin.  A1c steadily coming down.  Weight also coming down. On atorvastatin 40 mg and Zetia along with fish oil.  Labs well controlled.       Relevant Orders   EKG 12-Lead (Completed)   Essential hypertension (Chronic)    Blood pressures well controlled on current meds.  He is on calcium channel blocker, beta-blocker, HCTZ and ARB.  Technically would be resistant hypertension, but is well controlled now.  No change. Continue Lopressor 50 mg - 25 mg (1/2 tablet) twice daily; diltiazem CD 240 mg, losartan 100 mg along with HCTZ 25 mg.      Hyperlipidemia associated with type 2 diabetes mellitus (HCC) (Chronic)    Most recent lipid panel looks great.  LDL 55 on atorvastatin and Zetia along with fish oil.  The weight loss is definitely helping.  A1c down to 7.2  on combination of Jardiance, Ozempic and metformin.  Only taking glipizide once a day now.   Monitor for hypoglycemia.      Relevant Medications   JARDIANCE 10 MG TABS tablet   Permanent atrial fibrillation ([place]): CHA2DS2-VASc Score 3 (CAD, DM-2 HTN) - Primary (Chronic)    CHA2DS2-VASc score still 3.  Remains on Xarelto with no bleeding issues. Rate control with diltiazem and metoprolol.  Has not had any symptoms of tachycardia nor is any issues of bradycardia.  No dizziness or lightheadedness.      Relevant Orders   EKG 12-Lead (Completed)     Other   Obesity (BMI 30-39.9) (Chronic)    No longer obese.  Has lost enough weight that he is below the obesity criteria.      Relevant Medications   JARDIANCE 10 MG TABS tablet    ===================================  HPI:    Chris Wilcox is a 66 y.o. male with a PMH below who presents today for annual follow-up. He continues to follow-up at the request of Raelene Bott, MD.  PMH: Single-vessel CAD (occluded RCA), Permanent A. fib, HTN, HLD, and  DM-2 along with recurrent nephrolithiasis   Chris Wilcox was last seen on 11/28/2020: Doing well.  Still exercising.  Still coaching softball.  Doing routine walking and strength conditioning exercises.  Had lost more weight because starting on Ozempic.  Blood sugars better controlled.  Occasional palpitations, not prolonged.  Mild end of day swelling, worse when on  his feet all day long.  But no PND or B.   Recent Hospitalizations: None  He was just seen by Elisha Ponder, FNP.  I just checked an A1c and it was 7.9-recheck 11/27/2021 was 7.3.  Improvement after starting Jardiance.  Continue to lose weight.  No complaints of A-fib issues.  Reviewed  CV studies:    The following studies were reviewed today: (if available, images/films reviewed: From Epic Chart or Care Everywhere) None:  Interval History:   Chris Wilcox returns today for annual follow-up still doing well.  Still very active  exercising routinely. Softball season almost over -- started back with younger team 64y/o -- harder.  Takes a lot more patients.  Most exciting news that he just had a grandson born on November 1.  He is very excited about that this has not been out of to go see him yet.  From a cardiac standpoint he really does not have any symptoms whatsoever.  No sensation of being in A-fib.  No chest pain or pressure.  No tachycardic spells.  No PND orthopnea edema.  Exercises pretty routinely.  Still losing weight.  Just not sure why he is A1c is taking so long to come down.    Was working hard to try to stay active but adequately hydrated in the summertime.  Doing better now that the weather has cooled off.  Notes decreased appetite and has a sometimes forcing self to eat.  Reduce glipizide down to once a day.  Mild pedal neuropathy.  Tries to treat that with Robaxin and tramadol but does not really work well.  CV Review of Symptoms (Summary): Cardiovascular ROS: no chest pain or dyspnea on exertion positive for - mild end of day swelling.  Nothing significant. negative for - irregular heartbeat, orthopnea, palpitations, paroxysmal nocturnal dyspnea, rapid heart rate, shortness of breath, or syncope or near syncope, TIA/amaurosis fugax or claudication.  REVIEWED OF SYSTEMS   Review of Systems  Constitutional:  Positive for weight loss. Negative for malaise/fatigue.  HENT:  Negative for congestion and nosebleeds.   Respiratory:  Negative for cough and shortness of breath.   Cardiovascular:  Positive for leg swelling (Mild end of day swelling).  Gastrointestinal:  Negative for blood in stool and melena.  Genitourinary:  Positive for frequency. Negative for hematuria.  Musculoskeletal:  Positive for falls (He had a fall while coaching and bruised his arm.) and joint pain (Chronic hip knee and ankle pain from arthritis related to chronic sports.).  Neurological:  Positive for tingling (Pedal neuropathy).  Negative for dizziness.  Psychiatric/Behavioral: Negative.     I have reviewed and (if needed) personally updated the patient's problem list, medications, allergies, past medical and surgical history, social and family history.   PAST MEDICAL HISTORY   Past Medical History:  Diagnosis Date   Arthritis    Complication of anesthesia    slow to awaken after last knee arthroscopy   Coronary artery disease, occlusive 2008   CATH -- CTO OF RCA - unable to cross for PCI; L-R collateralization.    Hyperlipidemia    on statin montiored by PCP   Hypertension    well controlled   Myocardial infarction [facility]) 2008   Obesity    PAF (paroxysmal atrial fibrillation) ([place]) 03/2011   ATRIAL FIB wore Event MONITOR;  with CHADS/VASC score of 2 on anticoagulation   Pre-diabetes    checks cbg in am  qday    PAST SURGICAL HISTORY   Past Surgical History:  Procedure Laterality Date   ARTHROSCOPY KNEE W/ DRILLING Left yrs ago   x 2   CARDIAC CATHETERIZATION  01/20/2007   unsuccessful PCI of RCA   DOPPLER ECHOCARDIOGRAPHY  06/10/2011   EF =>55% -ATRIAL FIB,BORDERLINE BILEAFLET MITRAL VALVE PROLAPSE,MILD TO MOD MITARL INSUFF;MILD AORTIC INSUFF   index finger surgery Left 2009   NM MYOCAR PERF WALL MOTION  03/13/2011   LEXISCAN--EF 55% LV MILDLY REDUCED;BASAL INFERIOR AND MID INFERIOR SCAR   TOTAL KNEE ARTHROPLASTY Left 12/13/2012   Procedure: LEFT TOTAL KNEE ARTHROPLASTY;  Surgeon: Gearlean Alf, MD;  Location: [facility];  Service: Orthopedics;  Laterality: Left;    Immunization History  Administered Date(s) Administered   PFIZER Comirnaty(Gray Top)Covid-19 Tri-Sucrose Vaccine 07/26/2020   PFIZER(Purple Top)SARS-COV-2 Vaccination 01/04/2020, 07/26/2020    MEDICATIONS/ALLERGIES   Current Meds  Medication Sig   Acetaminophen (TYLENOL EXTRA STRENGTH PO) Take 1 tablet by mouth as needed (takes 1-2 as needed, usually in the afternoon.).   aspirin EC 81 MG tablet Take 81 mg by mouth every other  day.    ezetimibe (ZETIA) 10 MG tablet TAKE 1 TABLET BY MOUTH EVERY DAY   GLIPIZIDE PO Take 1 tablet by mouth daily.   hydrochlorothiazide (HYDRODIURIL) 25 MG tablet TAKE 1 TABLET BY MOUTH EVERY DAY   JARDIANCE 10 MG TABS tablet Take 10 mg by mouth daily.   losartan (COZAAR) 100 MG tablet TAKE 1 TABLET BY MOUTH EVERY DAY   methocarbamol (ROBAXIN) 500 MG tablet Take 1 tablet (500 mg total) by mouth every 6 (six) hours as needed for muscle spasms.   metoprolol tartrate (LOPRESSOR) 50 MG tablet TAKE 1/2 TABLETS (25 MG TOTAL) BY MOUTH 2 (TWO) TIMES DAILY.   nitroGLYCERIN (NITROSTAT) 0.4 MG SL tablet Place 0.4 mg under the tongue every 5 (five) minutes as needed for chest pain.   omega-3 fish oil (MAXEPA) 1000 MG CAPS capsule Take 3 capsules by mouth daily. Patient taking 2 capsules   OZEMPIC, 2 MG/DOSE, 8 MG/3ML SOPN Inject 2 mg into the skin once a week.   traMADol (ULTRAM) 50 MG tablet Take 1-2 tablets (50-100 mg total) by mouth every 6 (six) hours as needed (mild pain).   [DISCONTINUED] atorvastatin (LIPITOR) 40 MG tablet Take 1 tablet (40 mg total) by mouth daily. PATIENT MUST SCHEDULE ANNUAL APPOINTMENT FOR FUTURE REFILLS   [DISCONTINUED] diltiazem (CARDIZEM CD) 240 MG 24 hr capsule TAKE 1 CAPSULE (240 MG TOTAL) BY MOUTH DAILY. PLEASE SCHEDULE APPT FOR FURTHER REFILLS   [DISCONTINUED] rivaroxaban (XARELTO) 20 MG TABS tablet Take 1 tablet (20 mg total) by mouth daily with supper. Please call to schedule an appointment.   [DISCONTINUED] Semaglutide,0.25 or 0.5MG /DOS, 2 MG/1.5ML SOPN Inject 0.5 mg into the skin once a week.     Allergies  Allergen Reactions   Codeine Nausea And Vomiting    GI upset Other reaction(s): NAUSEA   Hydrocodone Nausea Only    SOCIAL HISTORY/FAMILY HISTORY   Reviewed in Epic:  Pertinent findings:  Social History   Tobacco Use   Smoking status: Never   Smokeless tobacco: Never  Substance Use Topics   Alcohol use: No   Drug use: No   Social History    Social History Narrative   Married father of 2. He runs a Scientist, clinical (histocompatibility and immunogenetics).   He is back toexercising at least 15-20 minutes a day 3-4 times a week.  He is able to ride the bicycle for about 10-15 minutes and then needs to stop.   He denies any  smoking or drinking history.    OBJCTIVE -PE, EKG, labs   Wt Readings from Last 3 Encounters:  12/09/21 203 lb (92.1 kg)  11/28/20 208 lb (94.3 kg)  10/31/19 211 lb 6.4 oz (95.9 kg)    Physical Exam: BP 126/77 (BP Location: Left Arm, Patient Position: Sitting)   Pulse 78   Ht 5\' 11"  (1.803 m)   Wt 203 lb (92.1 kg)   SpO2 99%   BMI 28.31 kg/m  Physical Exam Vitals reviewed.  Constitutional:      General: He is not in acute distress.    Appearance: Normal appearance. He is normal weight. He is not ill-appearing or toxic-appearing.  HENT:     Head: Normocephalic and atraumatic.  Neck:     Vascular: Carotid bruit present. No JVD.  Cardiovascular:     Rate and Rhythm: Normal rate. Rhythm irregularly irregular.     Chest Wall: PMI is not displaced.     Pulses: Normal pulses.     Heart sounds: S1 normal and S2 normal. Heart sounds are distant. No murmur heard.    No friction rub. No gallop.  Pulmonary:     Effort: Pulmonary effort is normal. No respiratory distress.     Breath sounds: Normal breath sounds. No wheezing or rales.  Chest:     Chest wall: No tenderness.  Musculoskeletal:        General: Swelling (Trivial ankle swelling) present.     Cervical back: Normal range of motion and neck supple.  Skin:    General: Skin is warm and dry.  Neurological:     General: No focal deficit present.     Mental Status: He is alert and oriented to person, place, and time.     Gait: Gait normal.  Psychiatric:        Mood and Affect: Mood normal.        Behavior: Behavior normal.        Thought Content: Thought content normal.        Judgment: Judgment normal.     Adult ECG Report  Rate: 78 ;  Rhythm: atrial fibrillation,  premature ventricular contractions (PVC), and abberantly conducted beats; Septal MI- age indeterminate ;   Narrative Interpretation: Stable  Recent Labs:   05/01/2021: TC 101, TG 59, HDL 34, LDL 55!!.  ; A1c 7.2  Ref Range & Units  10/04/2021   Sodium 135 - 145 mmol/L 136  Potassium 3.5 - 5.0 mmol/L 4.7  Chloride 98 - 107 mmol/L 100  CO2 22.0 - 32.0 mmol/L 26.0  Anion Gap 7 - 15 mmol/L 10  BUN 7 - 21 mg/dL 28 High   Creatinine 0.70 - 1.30 mg/dL 1.20  BUN/Creatinine Ratio  23  Glucose 74 - 106 mg/dL 153 High   Calcium 8.5 - 10.2 mg/dL 9.2   - CBC: W 8, H/H 11.9/34.1, Plt 268  ================================================== I spent a total of 22 minutes with the patient spent in direct patient consultation.  Additional time spent with chart review  / charting (studies, outside notes, etc): 14 min Total Time: 36 min  Current medicines are reviewed at length with the patient today.  (+/- concerns) none  Notice: This dictation was prepared with Dragon dictation along with smart phrase technology. Any transcriptional errors that result from this process are unintentional and may not be corrected upon review.  Studies Ordered:   Orders Placed This Encounter  Procedures   EKG 12-Lead   No orders of the defined types were  placed in this encounter.   Patient Instructions / Medication Changes & Studies & Tests Ordered   Patient Instructions  Medication Instructions:  No changes   *If you need a refill on your cardiac medications before your next appointment, please call your pharmacy*   Lab Work:  Not needed   Testing/Procedures: Not needed   Follow-Up: At [facility], you and your health needs are our priority.  As part of our continuing mission to provide you with exceptional heart care, we have created designated Provider Care Teams.  These Care Teams include your primary Cardiologist (physician) and Advanced Practice Providers (APPs -  Physician Assistants and  Nurse Practitioners) who all work together to provide you with the care you need, when you need it.     Your next appointment:   12 month(s)  The format for your next appointment:   In Person  Provider:   Glenetta Hew, MD       Leonie Man, MD, MS Glenetta Hew, M.D., M.S. Interventional Cardiologist  [place]  Pager # [phone] Phone # [phone] [address] [place], [place] [address]   Thank you for choosing [facility]!!

## 2021-12-09 NOTE — Patient Instructions (Addendum)

## 2021-12-29 ENCOUNTER — Other Ambulatory Visit: Payer: Self-pay | Admitting: Cardiology

## 2021-12-29 DIAGNOSIS — I4821 Permanent atrial fibrillation: Secondary | ICD-10-CM

## 2021-12-31 NOTE — Telephone Encounter (Signed)
Xarelto 20mg  refill request received. Pt is 66 years old, weight-92.1kg, Crea-1.20 on 10/04/2021 via Care Everywhere from [facility], last seen by [facility] on 12/29/2021, Diagnosis-Afib, CrCl-81.01 mL/min; Dose is appropriate based on dosing criteria. Will send in refill to requested pharmacy.

## 2022-01-05 ENCOUNTER — Encounter: Payer: Self-pay | Admitting: Cardiology

## 2022-01-05 NOTE — Assessment & Plan Note (Signed)
Single-vessel disease.  No active angina.  Has left-to-right collaterals.  Has not had angina since that noted.   Plan:  On combination of diltiazem and metoprolol for rate control and antianginal benefit.  Tolerating current doses.  No change. On high-dose losartan Continue with aspirin along with Xarelto as long as not having bleeding issues.  Okay to hold aspirin if he does.  He is taking every other day. Also on GLP-1 agonist and SGLT2-inhibitor along with metformin.  A1c steadily coming down.  Weight also coming down. On atorvastatin 40 mg and Zetia along with fish oil.  Labs well controlled.

## 2022-01-05 NOTE — Assessment & Plan Note (Signed)
CHA2DS2-VASc score still 3.  Remains on Xarelto with no bleeding issues. Rate control with diltiazem and metoprolol.  Has not had any symptoms of tachycardia nor is any issues of bradycardia.  No dizziness or lightheadedness.

## 2022-01-05 NOTE — Assessment & Plan Note (Signed)
Most recent lipid panel looks great.  LDL 55 on atorvastatin and Zetia along with fish oil.  The weight loss is definitely helping.  A1c down to 7.2 on combination of Jardiance, Ozempic and metformin.  Only taking glipizide once a day now.   Monitor for hypoglycemia.

## 2022-01-05 NOTE — Assessment & Plan Note (Signed)
No longer obese.  Has lost enough weight that he is below the obesity criteria.

## 2022-01-05 NOTE — Assessment & Plan Note (Addendum)
Blood pressures well controlled on current meds.  He is on calcium channel blocker, beta-blocker, HCTZ and ARB.  Technically would be resistant hypertension, but is well controlled now.  No change. Continue Lopressor 50 mg - 25 mg (1/2 tablet) twice daily; diltiazem CD 240 mg, losartan 100 mg along with HCTZ 25 mg.

## 2022-04-06 ENCOUNTER — Other Ambulatory Visit: Payer: Self-pay | Admitting: Cardiology

## 2022-04-10 ENCOUNTER — Other Ambulatory Visit: Payer: Self-pay | Admitting: Cardiology

## 2022-04-14 ENCOUNTER — Other Ambulatory Visit: Payer: Self-pay | Admitting: Cardiology

## 2022-05-14 ENCOUNTER — Other Ambulatory Visit: Payer: Self-pay | Admitting: Cardiology

## 2022-07-08 ENCOUNTER — Other Ambulatory Visit: Payer: Self-pay | Admitting: Cardiology

## 2022-08-10 ENCOUNTER — Other Ambulatory Visit: Payer: Self-pay | Admitting: Cardiology

## 2022-08-10 DIAGNOSIS — I4821 Permanent atrial fibrillation: Secondary | ICD-10-CM

## 2022-08-11 NOTE — Telephone Encounter (Signed)
Xarelto 20mg  refill request received. Pt is 67 years old, weight-92.1kg, Crea-1.40 on 06/04/22 via Care Everywhere from [facility], last seen by Dr. Herbie Baltimore on 12/09/21, Diagnosis-Afib, CrCl-68.53 mL/min; Dose is appropriate based on dosing criteria. Will send in refill to requested pharmacy.

## 2022-09-20 ENCOUNTER — Other Ambulatory Visit: Payer: Self-pay | Admitting: Cardiology

## 2022-10-10 ENCOUNTER — Other Ambulatory Visit: Payer: Self-pay | Admitting: Cardiology

## 2022-12-04 ENCOUNTER — Other Ambulatory Visit: Payer: Self-pay | Admitting: Cardiology

## 2022-12-26 ENCOUNTER — Encounter: Payer: Self-pay | Admitting: Cardiology

## 2022-12-26 ENCOUNTER — Ambulatory Visit: Payer: Medicare Other | Attending: Cardiology | Admitting: Cardiology

## 2022-12-26 VITALS — BP 100/60 | HR 74 | Ht 72.0 in | Wt 198.0 lb

## 2022-12-26 DIAGNOSIS — E785 Hyperlipidemia, unspecified: Secondary | ICD-10-CM | POA: Insufficient documentation

## 2022-12-26 DIAGNOSIS — E1169 Type 2 diabetes mellitus with other specified complication: Secondary | ICD-10-CM | POA: Insufficient documentation

## 2022-12-26 DIAGNOSIS — D6869 Other thrombophilia: Secondary | ICD-10-CM | POA: Diagnosis present

## 2022-12-26 DIAGNOSIS — I2510 Atherosclerotic heart disease of native coronary artery without angina pectoris: Secondary | ICD-10-CM | POA: Diagnosis present

## 2022-12-26 DIAGNOSIS — I10 Essential (primary) hypertension: Secondary | ICD-10-CM | POA: Insufficient documentation

## 2022-12-26 DIAGNOSIS — I4821 Permanent atrial fibrillation: Secondary | ICD-10-CM | POA: Insufficient documentation

## 2022-12-26 NOTE — Patient Instructions (Signed)
Medication Instructions:  Stop Asprin Continue taking all other medication   *If you need a refill on your cardiac medications before your next appointment, please call your pharmacy*   Lab Work: None needed   Testing/Procedures: None ordered   Follow-Up: At [facility], you and your health needs are our priority.  As part of our continuing mission to provide you with exceptional heart care, we have created designated Provider Care Teams.  These Care Teams include your primary Cardiologist (physician) and Advanced Practice Providers (APPs -  Physician Assistants and Nurse Practitioners) who all work together to provide you with the care you need, when you need it.    Your next appointment:   1 year(s)  Provider:   Bryan Lemma, MD

## 2022-12-26 NOTE — Progress Notes (Unsigned)
Cardiology Office Note:  .   Date:  12/28/2022  ID:  Chris Wilcox, DOB 1955-04-22, MRN [id number] PCP: Lindwood Qua, MD  [facility] Providers Cardiologist:  Bryan Lemma, MD     Chief Complaint  Patient presents with   Follow-up    Doing well.  No symptoms of A-fib.   Coronary Artery Disease    RCA CTO, LAD   Atrial Fibrillation    Permanent A-fib.  No symptoms.  No bleeding.    Patient Profile: .     Chris Wilcox is a Very pleasant 67 y.o. male with a PMH noted below who presents here for annual follow-up at the request of Lindwood Qua, MD & Libby Maw, NP  PMH: Single-vessel CAD (occluded RCA), Permanent A. Fib, HTN, HLD, and  DM-2 along with recurrent nephrolithiasis   Shay is very active involved in sports.  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  Also does strength and conditioning exercises and walks most days.  He had a grand son morning shortly before his visit in November 2023     [name] was last seen on In November 2023-doing well with no major issues.  No changes made.  Still losing weight with Ozempic.  Subjective  Discussed the use of AI scribe software for clinical note transcription with the patient, who gave verbal consent to proceed.  History of Present Illness   The patient, known with a history of permanent atrial fibrillation (Afib), diabetes, and coronary artery disease (NSTEMI - RCA CTO), who presents for a routine follow-up. He has been in Afib for an extended period, which he describes as asymptomatic.    Regarding his coronary artery disease, the patient had a heart attack approximately 15 years ago, with coronary angiography revealing chronically occluded right coronary artery (RCA).  He reports no chest pain or shortness of breath, and he continues to engage in his daily activities without any significant limitations.  He is more limited by chronic back pain > any cardiac issues.   He is currently on a  regimen of Metoprolol, Diltiazem, Losartan, HCTZ, Zetia, and Atorvastatin.  He also takes Xarelto as a blood thinner and reports no bleeding issues. However, he frequently experiences bruising, particularly on his hands.  The patient also reports some lower extremity edema, which is worse at the end of the day after being on his feet. The swelling resolves overnight with elevation.   The patient's diabetes is under borderline control, with a recent HbA1c of 7.6. He is on a regimen of Ozempic, Jardiance, and Metformin. He expresses a desire to avoid insulin therapy if possible.  The patient also mentions a history of back problems, which limit his ability to lift heavy objects. He had an accidental fall off a bucket last summer, resulting in two cracked ribs. The patient reports that he has mostly recovered from this incident, although he can still feel some residual discomfort.  Despite his medical conditions, the patient remains active, working daily and coaching softball. He expresses a desire to continue these activities as long as his health permits.      ROS:  Review of Systems - Negative except back pain limits exercise.  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    Objective   Current Meds  Medication Sig   Acetaminophen (TYLENOL EXTRA STRENGTH PO) Take 1 tablet by mouth as needed (takes 1-2 as needed, usually in the afternoon.).   aspirin EC 81 MG tablet  Take 81 mg by mouth every other day.    atorvastatin (LIPITOR) 40 MG tablet Take 1 tablet (40 mg total) by mouth daily.   diltiazem (CARDIZEM CD) 240 MG 24 hr capsule Take 1 capsule (240 mg total) by mouth daily.   ezetimibe (ZETIA) 10 MG tablet TAKE 1 TABLET BY MOUTH EVERY DAY   GLIPIZIDE PO Take 1 tablet by mouth daily.   hydrochlorothiazide (HYDRODIURIL) 25 MG tablet TAKE 1 TABLET BY MOUTH EVERY DAY   JARDIANCE 10 MG TABS tablet Take 10 mg by mouth daily.   losartan (COZAAR) 100 MG tablet TAKE 1 TABLET BY MOUTH EVERY DAY    methocarbamol (ROBAXIN) 500 MG tablet Take 1 tablet (500 mg total) by mouth every 6 (six) hours as needed for muscle spasms.   metoprolol tartrate (LOPRESSOR) 50 MG tablet TAKE 1/2 TABLETS (25 MG TOTAL) BY MOUTH 2 (TWO) TIMES DAILY.   nitroGLYCERIN (NITROSTAT) 0.4 MG SL tablet Place 0.4 mg under the tongue every 5 (five) minutes as needed for chest pain.   omega-3 fish oil (MAXEPA) 1000 MG CAPS capsule Take 3 capsules by mouth daily. Patient taking 2 capsules   OZEMPIC, 2 MG/DOSE, 8 MG/3ML SOPN Inject 2 mg into the skin once a week.   traMADol (ULTRAM) 50 MG tablet Take 1-2 tablets (50-100 mg total) by mouth every 6 (six) hours as needed (mild pain).   XARELTO 20 MG TABS tablet TAKE 1 TABLET BY MOUTH DAILY WITH SUPPER.    Studies Reviewed: Marland Kitchen   EKG Interpretation Date/Time:  Friday December 26 2022 09:16:21 EST Ventricular Rate:  74 PR Interval:    QRS Duration:  96 QT Interval:  396 QTC Calculation: 439 R Axis:   56  Text Interpretation: Atrial fibrillation Low voltage QRS Septal infarct (cited on or before 21-Jan-2007) When compared with ECG of 03-Dec-2012 13:49, Confirmed by Bryan Lemma ([id number]) on 12/26/2022 9:23:46 AM   NO NEW STUDIES Just had labs checked @ PCP office: HbA1c: 7.6% (12/26/2022) Total cholesterol: 112 mg/dL (78/29/5621) Triglycerides: 70 mg/dL (30/86/5784) HDL: 38 mg/dL (69/62/9528) LDL: 60 mg/dL (41/32/4401) Creatinine: 1.4 mg/dL (02/72/5366)  Risk Assessment/Calculations:    CHA2DS2-VASc Score = 4   This indicates a 4.8% annual risk of stroke. The patient's score is based upon: CHF History: 0 HTN History: 1 Diabetes History: 1 Stroke History: 0 Vascular Disease History: 1 Age Score: 1 Gender Score: 0   ON DOAC    Physical Exam:   VS:  BP 100/60 (BP Location: Left Arm, Patient Position: Sitting, Cuff Size: Normal)   Pulse 74   Ht 6' (1.829 m)   Wt 198 lb (89.8 kg)   SpO2 97%   BMI 26.85 kg/m    Wt Readings from Last 3 Encounters:  12/26/22  198 lb (89.8 kg)  12/09/21 203 lb (92.1 kg)  11/28/20 208 lb (94.3 kg)    GEN: Well nourished, well developed in no acute distress; healthy-appearing.  Well-groomed. NECK: No JVD; No carotid bruits CARDIAC: Normal rate with irregularly irregular rhythm.  Normal S1, S2; no murmurs, rubs, gallops RESPIRATORY:  Clear to auscultation without rales, wheezing or rhonchi ; nonlabored, good air movement. ABDOMEN: Soft, non-tender, non-distended EXTREMITIES: Trivial bilateral LE edema; No deformity     ASSESSMENT AND PLAN: .    Problem List Items Addressed This Visit       Cardiology Problems   Coronary artery disease, occlusive - RCA Occlusion, previously unable to cross occlusion. (Chronic)    History of occluded RCA 15 years  ago. No current angina symptoms. -Continue Metoprolol 25mg  twice daily, along with diltiazem 240 mg daily and losartan 100 mg daily. -Continue metformin 500 mg twice daily, Jardiance 10 mg daily, and Ozempic 2 mg weekly -Continue Atorvastatin 40mg  daily and Zetia 10mg  daily for cholesterol management. -Stop aspirin and continue maintenance dose Xarelto      Relevant Orders   EKG 12-Lead (Completed)   Essential hypertension (Chronic)    Well controlled on current regimen. -Continue Losartan 100mg  daily and HCTZ 25mg  daily along with 25 mg twice daily Lopressor and 240 mg daily diltiazem XT      Relevant Orders   EKG 12-Lead (Completed)   Hypercoagulable state due to permanent atrial fibrillation (HCC) (Chronic)    CHA2DS2-VASc Score = 4  Continue on Xarelto 20 mg daily. Okay to hold Xarelto 2 to 3 days preop for surgeries or procedures.  No need for bridging      Hyperlipidemia associated with type 2 diabetes mellitus (HCC) (Chronic)    Most recent LDL 61 total cholesterol 112.  Well-controlled. -Continue 40 mg atorvastatin-Labs followed by PCP.  A1c of 7.6. On multiple oral hypoglycemic agents. -Continue Ozempic 2mg  weekly, Jardiance daily, and Metformin  500mg  twice daily. -Monitor kidney function due to diabetes.      Relevant Orders   EKG 12-Lead (Completed)   Permanent atrial fibrillation (HCC): CHA2DS2-VASc Score 3 (CAD, DM-2 HTN) - Primary (Chronic)    Permanent Afib for approximately 15 years.  Asymptomatic-rate controlled on current regimen. No signs of heart failure. -Continue Xarelto for anticoagulation. -Discontinue Aspirin due to bruising and bleeding concerns. -Continue Metoprolol 25mg  twice daily and Diltiazem 240mg  daily for rate control.      Relevant Orders   EKG 12-Lead (Completed)  Heart General Health Maintenance / Followup Plans -Ensure lab results are sent from primary care provider. -We are discontinuing Aspirin due to bruising and bleeding concerns.        Follow-Up: Return in about 1 year (around 12/26/2023) for 1 Yr Follow-up. -Annual follow-up visit.   Total time spent: 20 min spent with patient + 18 min spent charting = 38 min    Signed, Marykay Lex, MD, MS Bryan Lemma, M.D., M.S. Interventional Cardiologist  [facility]  Pager # [phone] Phone # [phone] [address] [place], [place] [address]

## 2022-12-28 ENCOUNTER — Encounter: Payer: Self-pay | Admitting: Cardiology

## 2022-12-28 DIAGNOSIS — I4821 Permanent atrial fibrillation: Secondary | ICD-10-CM | POA: Insufficient documentation

## 2022-12-28 NOTE — Assessment & Plan Note (Signed)
Most recent LDL 61 total cholesterol 112.  Well-controlled. -Continue 40 mg atorvastatin-Labs followed by PCP.  A1c of 7.6. On multiple oral hypoglycemic agents. -Continue Ozempic 2mg  weekly, Jardiance daily, and Metformin 500mg  twice daily. -Monitor kidney function due to diabetes.

## 2022-12-28 NOTE — Assessment & Plan Note (Addendum)
History of occluded RCA 15 years ago. No current angina symptoms. -Continue Metoprolol 25mg  twice daily, along with diltiazem 240 mg daily and losartan 100 mg daily. -Continue metformin 500 mg twice daily, Jardiance 10 mg daily, and Ozempic 2 mg weekly -Continue Atorvastatin 40mg  daily and Zetia 10mg  daily for cholesterol management. -Stop aspirin and continue maintenance dose Xarelto

## 2022-12-28 NOTE — Assessment & Plan Note (Signed)
CHA2DS2-VASc Score = 4  Continue on Xarelto 20 mg daily. Okay to hold Xarelto 2 to 3 days preop for surgeries or procedures.  No need for bridging

## 2022-12-28 NOTE — Assessment & Plan Note (Signed)
Permanent Afib for approximately 15 years.  Asymptomatic-rate controlled on current regimen. No signs of heart failure. -Continue Xarelto for anticoagulation. -Discontinue Aspirin due to bruising and bleeding concerns. -Continue Metoprolol 25mg  twice daily and Diltiazem 240mg  daily for rate control.

## 2022-12-28 NOTE — Assessment & Plan Note (Signed)
Well controlled on current regimen. -Continue Losartan 100mg  daily and HCTZ 25mg  daily along with 25 mg twice daily Lopressor and 240 mg daily diltiazem XT

## 2023-01-03 ENCOUNTER — Other Ambulatory Visit: Payer: Self-pay | Admitting: Cardiology

## 2023-01-07 ENCOUNTER — Other Ambulatory Visit (HOSPITAL_COMMUNITY): Payer: Self-pay

## 2023-01-07 ENCOUNTER — Other Ambulatory Visit: Payer: Self-pay

## 2023-01-07 MED ORDER — DILTIAZEM HCL ER COATED BEADS 240 MG PO CP24
240.0000 mg | ORAL_CAPSULE | Freq: Every day | ORAL | 3 refills | Status: AC
  Filled 2023-01-07: qty 90, 90d supply, fill #0

## 2023-01-09 ENCOUNTER — Other Ambulatory Visit: Payer: Self-pay | Admitting: Cardiology

## 2023-01-11 ENCOUNTER — Other Ambulatory Visit: Payer: Self-pay | Admitting: Cardiology

## 2023-01-11 DIAGNOSIS — I4821 Permanent atrial fibrillation: Secondary | ICD-10-CM

## 2023-01-12 NOTE — Telephone Encounter (Signed)
Prescription refill request for Xarelto received.  Indication:afib Last office visit:11/24 Weight:89.8  kg Age:67 Scr:1.4  11/24 CrCl:66.82  ml/min  Prescription refilled

## 2023-01-29 ENCOUNTER — Other Ambulatory Visit: Payer: Self-pay | Admitting: Cardiology

## 2023-08-03 ENCOUNTER — Other Ambulatory Visit: Payer: Self-pay | Admitting: Cardiology

## 2023-08-03 DIAGNOSIS — I4821 Permanent atrial fibrillation: Secondary | ICD-10-CM

## 2023-08-04 DIAGNOSIS — M1990 Unspecified osteoarthritis, unspecified site: Secondary | ICD-10-CM | POA: Insufficient documentation

## 2023-08-04 NOTE — Telephone Encounter (Signed)
 Prescription refill request for Xarelto  received.  Indication: a fib  Last office visit: 12/26/22 Weight: 198# Age:69 Scr: 1.3 CrCl: 71 ml/min

## 2023-10-06 NOTE — Anesthesia Preprocedure Evaluation (Addendum)
 Procedure Information:  Date/Time: 10/14/23 0850   Procedure: LEFT URETEROSCOPY WITH LASER LITHOTRIPSY, RETROGRADE PYELOGRAM  AND URETERAL STENT PLACEMENT (Left)   Anesthesia type: General   Diagnosis: Kidney stone on left side [N20.0]   Pre-op diagnosis: LEFT KIDNEY STONE   Location: OR EAST 11 REX / OR REX   Surgeons: Caretha Jadene Moment, MD     Anesthesia Evaluation     No history of anesthetic complications No family history of anesthetic complications  Airway  Mallampati: III TM distance: >3 FB Neck ROM: limited Mouth Opening: normal   Dental    (+) implants   Pulmonary - negative ROS and normal exam   breath sounds clear to auscultation  Cardiovascular  Exercise tolerance: good (+) hyperlipidemia, hypertension well controlled, past MI, CAD (CTO of RCA 16 years ago.  Followed by cardiology.), dysrhythmias (Permanent afib on xarelto ) and AFIB ,   Rhythm: irregular Rate: normal ROS comment: 9/25 EKG A-fib Low voltage    Neuro/Psych - negative ROS  (-) TIA, CVA  GI/Hepatic/Renal   (+) chronic renal disease CRI  (-) bowel prep   Endo/Other   (+) diabetes mellitus type 2  Comments: obesity  Abdominal  - normal exam  OB/GYN      HEENT - negative ROS                 Anesthesia Plan  ASA 3   NPO Appropriate? Yes  Anesthetic:  General  Standard lines and monitors.  Type of induction: IV Airway: endotracheal tube                                          Post Procedure Pain Management:IV analgesics and oral pain medication.  (Pt takes Ozempic )  Anesthesia plan and risk discussed with patient; informed consent obtained.  Use of blood products discussed with patient, whom consented to blood products.    Patient has not received blood products within the last 30 days. Not Pregnant  Plan discussed with CRNA.    Relevant Problems  ANESTHESIA  (+) Arthritis  (+) Coronary artery disease  (+) Essential hypertension  (+)  Kidney stone  (+) Paroxysmal atrial fibrillation    (CMS-HCC)  (+) Type 2 diabetes mellitus with stage 3 chronic kidney disease, without long-term current use of insulin    (CMS-HCC)  (+) Type 2 diabetes mellitus with vascular disease    (CMS-HCC)

## 2023-10-07 ENCOUNTER — Telehealth: Payer: Self-pay

## 2023-10-07 NOTE — Telephone Encounter (Signed)
 No answer. Tried to reach pt to schedule tele preop appt.

## 2023-10-07 NOTE — Telephone Encounter (Signed)
   Name: Chris Wilcox  DOB: 05-16-1955  MRN: [id number]  Primary Cardiologist: Alm Clay, MD   Preoperative team, please contact this patient and set up a phone call appointment for further preoperative risk assessment. Please obtain consent and complete medication review. Thank you for your help.  I confirm that guidance regarding antiplatelet and oral anticoagulation therapy has been completed and, if necessary, noted below.  - Pharmacy please give recommendations for DOAC.  I also confirmed the patient resides in the state of [place] . As per [facility] Medical Board telemedicine laws, the patient must reside in the state in which the provider is licensed.   Thom LITTIE Sluder, PA-C 10/07/2023, 4:38 PM [facility]

## 2023-10-07 NOTE — Telephone Encounter (Signed)
   Pre-operative Risk Assessment    Patient Name: Chris Wilcox  DOB: 1955-05-22 MRN: [id number]   Date of last office visit: 12/26/22 Date of next office visit: 12/23/23   Request for Surgical Clearance    Procedure:  Utereroscopy  Date of Surgery:  Clearance TBD                                Surgeon:  Dr. Jadene Lesch Surgeon's Group or Practice Name:  [facility] Phone number:  [phone] Fax number:  [phone]   Type of Clearance Requested:   - Medical  - Pharmacy:  Hold Rivaroxaban  (Xarelto )     Type of Anesthesia:  General    Additional requests/questions:    Bonney Ival LOISE Gerome   10/07/2023, 3:52 PM

## 2023-10-08 ENCOUNTER — Telehealth: Payer: Self-pay | Admitting: *Deleted

## 2023-10-08 NOTE — Telephone Encounter (Signed)
 Pt called back and has been scheduled as add on ok per Barnie Hila, NP for 10/12/23, due med hold and procedure date. Med rec and consent are done.

## 2023-10-08 NOTE — Telephone Encounter (Signed)
   Name: Chris Wilcox  DOB: [birth year]  MRN: [id number]  Primary Cardiologist: Alm Clay, MD   Preoperative team, please contact this patient and set up a phone call appointment for further preoperative risk assessment. Please obtain consent and complete medication review. Thank you for your help.  I confirm that guidance regarding antiplatelet and oral anticoagulation therapy has been completed and, if necessary, noted below.  Per Pharm D, patient has not had an Afib/aflutter ablation within the last 3 months or DCCV within the last 30 days. Patient may hold Xarelto  for 2 days prior to procedure.    I also confirmed the patient resides in the state of [place] . As per [facility] Medical Board telemedicine laws, the patient must reside in the state in which the provider is licensed.    Barnie Hila, NP 10/08/2023, 10:15 AM [facility]

## 2023-10-08 NOTE — Telephone Encounter (Signed)
 Pt called back and has been scheduled as add on ok per Barnie Hila, NP for 10/12/23, due med hold and procedure date. Med rec and consent are done.      Patient Consent for Virtual Visit        Chris Wilcox has provided verbal consent on 10/08/2023 for a virtual visit (video or telephone).   CONSENT FOR VIRTUAL VISIT FOR:  Chris Wilcox  By participating in this virtual visit I agree to the following:  I hereby voluntarily request, consent and authorize [facility] and its employed or contracted physicians, physician assistants, nurse practitioners or other licensed health care professionals (the Practitioner), to provide me with telemedicine health care services (the "Services) as deemed necessary by the treating Practitioner. I acknowledge and consent to receive the Services by the Practitioner via telemedicine. I understand that the telemedicine visit will involve communicating with the Practitioner through live audiovisual communication technology and the disclosure of certain medical information by electronic transmission. I acknowledge that I have been given the opportunity to request an in-person assessment or other available alternative prior to the telemedicine visit and am voluntarily participating in the telemedicine visit.  I understand that I have the right to withhold or withdraw my consent to the use of telemedicine in the course of my care at any time, without affecting my right to future care or treatment, and that the Practitioner or I may terminate the telemedicine visit at any time. I understand that I have the right to inspect all information obtained and/or recorded in the course of the telemedicine visit and may receive copies of available information for a reasonable fee.  I understand that some of the potential risks of receiving the Services via telemedicine include:  Delay or interruption in medical evaluation due to technological equipment failure or  disruption; Information transmitted may not be sufficient (e.g. poor resolution of images) to allow for appropriate medical decision making by the Practitioner; and/or  In rare instances, security protocols could fail, causing a breach of personal health information.  Furthermore, I acknowledge that it is my responsibility to provide information about my medical history, conditions and care that is complete and accurate to the best of my ability. I acknowledge that Practitioner's advice, recommendations, and/or decision may be based on factors not within their control, such as incomplete or inaccurate data provided by me or distortions of diagnostic images or specimens that may result from electronic transmissions. I understand that the practice of medicine is not an exact science and that Practitioner makes no warranties or guarantees regarding treatment outcomes. I acknowledge that a copy of this consent can be made available to me via my patient portal [facility] MyChart), or I can request a printed copy by calling the office of [facility].    I understand that my insurance will be billed for this visit.   I have read or had this consent read to me. I understand the contents of this consent, which adequately explains the benefits and risks of the Services being provided via telemedicine.  I have been provided ample opportunity to ask questions regarding this consent and the Services and have had my questions answered to my satisfaction. I give my informed consent for the services to be provided through the use of telemedicine in my medical care

## 2023-10-08 NOTE — Telephone Encounter (Signed)
 Patient with diagnosis of afib on Xarelto  for anticoagulation.    Procedure:  Utereroscopy  Date of procedure: TBD   CHA2DS2-VASc Score = 4   This indicates a 4.8% annual risk of stroke. The patient's score is based upon: CHF History: 0 HTN History: 1 Diabetes History: 1 Stroke History: 0 Vascular Disease History: 1 Age Score: 1 Gender Score: 0      CrCl 71 ml/min Platelet count 197  Patient has not had an Afib/aflutter ablation within the last 3 months or DCCV within the last 30 days  Per office protocol, patient can hold Xarelto  for 2 days prior to procedure.    **This guidance is not considered finalized until pre-operative APP has relayed final recommendations.**

## 2023-10-08 NOTE — Telephone Encounter (Signed)
 2nd attempt to reach the pt to schedule tele preop appt.

## 2023-10-12 ENCOUNTER — Ambulatory Visit: Attending: Internal Medicine

## 2023-10-12 DIAGNOSIS — Z01810 Encounter for preprocedural cardiovascular examination: Secondary | ICD-10-CM | POA: Diagnosis not present

## 2023-10-12 NOTE — Progress Notes (Signed)
 Virtual Visit via Telephone Note   Because of [place] co-morbid illnesses, he is at least at moderate risk for complications without adequate follow up.  This format is felt to be most appropriate for this patient at this time.  Due to technical limitations with video connection (technology), today's appointment will be conducted as an audio only telehealth visit, and Chris Wilcox verbally agreed to proceed in this manner.   All issues noted in this document were discussed and addressed.  No physical exam could be performed with this format.  Evaluation Performed:  Preoperative cardiovascular risk assessment _____________   Date:  10/12/2023   Patient ID:  Chris Wilcox, DOB [birth year], MRN [id number] Patient Location:  Home Provider location:   Office  Primary Care Provider:  Rosan Mix, MD Primary Cardiologist:  Alm Clay, MD  Chief Complaint / Patient Profile   68 y.o. y/o male with a h/o atrial fibrillation, coronary artery disease, hypertension, hyperlipidemia who is pending ureteroscopy and presents today for telephonic preoperative cardiovascular risk assessment.  History of Present Illness    Chris Wilcox is a 68 y.o. male who presents via audio/video conferencing for a telehealth visit today.  Pt was last seen in cardiology clinic on 12/26/2022 by Dr. Clay.  At that time Chris Wilcox was doing well .  The patient is now pending procedure as outlined above. Since his last visit, he continues to be stable from a cardiac standpoint.  Today he denies chest pain, shortness of breath, lower extremity edema, fatigue, palpitations, melena, hematuria, hemoptysis, diaphoresis, weakness, presyncope, syncope, orthopnea, and PND.   Past Medical History    Past Medical History:  Diagnosis Date   Arthritis    Complication of anesthesia    slow to awaken after last knee arthroscopy   Coronary artery disease, occlusive 2008   CATH -- CTO OF RCA - unable to cross for PCI; L-R  collateralization.    Hyperlipidemia    on statin montiored by PCP   Hypertension    well controlled   Myocardial infarction (HCC) 2008   Obesity    PAF (paroxysmal atrial fibrillation) (HCC) 03/2011   ATRIAL FIB wore Event MONITOR;  with CHADS/VASC score of 2 on anticoagulation   Pre-diabetes    checks cbg in am  qday   Past Surgical History:  Procedure Laterality Date   ARTHROSCOPY KNEE W/ DRILLING Left yrs ago   x 2   CARDIAC CATHETERIZATION  01/20/2007   unsuccessful PCI of RCA   DOPPLER ECHOCARDIOGRAPHY  06/10/2011   EF =>55% -ATRIAL FIB,BORDERLINE BILEAFLET MITRAL VALVE PROLAPSE,MILD TO MOD MITARL INSUFF;MILD AORTIC INSUFF   index finger surgery Left 2009   NM MYOCAR PERF WALL MOTION  03/13/2011   LEXISCAN--EF 55% LV MILDLY REDUCED;BASAL INFERIOR AND MID INFERIOR SCAR   TOTAL KNEE ARTHROPLASTY Left 12/13/2012   Procedure: LEFT TOTAL KNEE ARTHROPLASTY;  Surgeon: Dempsey LULLA Moan, MD;  Location: [facility];  Service: Orthopedics;  Laterality: Left;    Allergies  Allergies  Allergen Reactions   Codeine Nausea And Vomiting    GI upset Other reaction(s): NAUSEA   Hydrocodone  Nausea Only    Home Medications    Prior to Admission medications   Medication Sig Start Date End Date Taking? Authorizing Provider  Acetaminophen  (TYLENOL  EXTRA STRENGTH PO) Take 1 tablet by mouth as needed (takes 1-2 as needed, usually in the afternoon.).    [provider]  aspirin  EC 81 MG tablet Take 81 mg by mouth every other day.  [provider]  atorvastatin  (LIPITOR) 40 MG tablet TAKE 1 TABLET BY MOUTH EVERY DAY 08/04/23   Anner Alm ORN, MD  diltiazem  (CARDIZEM  CD) 240 MG 24 hr capsule Take 1 capsule (240 mg total) by mouth daily. 01/07/23   Anner Alm ORN, MD  ezetimibe  (ZETIA ) 10 MG tablet TAKE 1 TABLET BY MOUTH EVERY DAY 01/29/23   Anner Alm ORN, MD  GLIPIZIDE PO Take 1 tablet by mouth daily.    [provider]  hydrochlorothiazide  (HYDRODIURIL ) 25 MG  tablet TAKE 1 TABLET BY MOUTH EVERY DAY 01/29/23   Anner Alm ORN, MD  JARDIANCE 10 MG TABS tablet Take 10 mg by mouth daily.    [provider]  losartan  (COZAAR ) 100 MG tablet TAKE 1 TABLET BY MOUTH EVERY DAY 01/29/23   Anner Alm ORN, MD  metFORMIN (GLUCOPHAGE) 500 MG tablet Take 500 mg by mouth in the morning and at bedtime. 04/08/19 10/08/23  [provider]  methocarbamol  (ROBAXIN ) 500 MG tablet Take 1 tablet (500 mg total) by mouth every 6 (six) hours as needed for muscle spasms. 12/16/12   Perkins, Alexzandrew L, PA-C  metoprolol  tartrate (LOPRESSOR ) 50 MG tablet TAKE 1/2 TABLETS (25 MG TOTAL) BY MOUTH 2 (TWO) TIMES DAILY. 12/04/22   Anner Alm ORN, MD  nitroGLYCERIN  (NITROSTAT ) 0.4 MG SL tablet Place 0.4 mg under the tongue every 5 (five) minutes as needed for chest pain.    [provider]  omega-3 fish oil (MAXEPA) 1000 MG CAPS capsule Take 3 capsules by mouth daily. Patient taking 2 capsules    [provider]  OZEMPIC, 2 MG/DOSE, 8 MG/3ML SOPN Inject 2 mg into the skin once a week. 10/31/20   [provider]  traMADol  (ULTRAM ) 50 MG tablet Take 1-2 tablets (50-100 mg total) by mouth every 6 (six) hours as needed (mild pain). 12/16/12   Perkins, Alexzandrew L, PA-C  XARELTO  20 MG TABS tablet TAKE 1 TABLET BY MOUTH DAILY WITH SUPPER 08/04/23   Anner Alm ORN, MD    Physical Exam    Vital Signs:  Chris Wilcox does not have vital signs available for review today.  Given telephonic nature of communication, physical exam is limited. AAOx3. NAD. Normal affect.  Speech and respirations are unlabored.  Accessory Clinical Findings    None  Assessment & Plan    1.  Preoperative Cardiovascular Risk Assessment:Utereroscopy   Date of Surgery:  Clearance TBD                                  Surgeon:  Dr. Jadene Lesch Surgeon's Group or Practice Name:  [facility] Phone number:  [phone] Fax number:  [phone]    Type of Clearance Requested:   - Medical  - Pharmacy:  Hold Rivaroxaban  (Xarelto )        Primary Cardiologist: Alm Anner, MD  Chart reviewed as part of pre-operative protocol coverage. Given past medical history and time since last visit, based on ACC/AHA guidelines, Chris Wilcox would be at acceptable risk for the planned procedure without further cardiovascular testing.   His RCRI is low risk, 0.9% risk of major cardiac event.  He is able to complete greater than 4 METS of physical activity.  Patient was advised that if he develops new symptoms prior to surgery to contact our office to arrange a follow-up appointment.  He verbalized understanding.  Per Pharm D, patient has not  had an Afib/aflutter ablation within the last 3 months or DCCV within the last 30 days. Patient may hold Xarelto  for 2 days prior to procedure.    I will route this recommendation to the requesting party via Epic fax function and remove from pre-op pool.      Time:   Today, I have spent  5 minutes with the patient with telehealth technology discussing medical history, symptoms, and management plan.  I spent 10 minutes reviewing patient's past cardiac history and cardiac medications.    Josefa CHRISTELLA Beauvais, NP  10/12/2023, 8:12 AM

## 2023-10-20 ENCOUNTER — Other Ambulatory Visit: Payer: Self-pay | Admitting: Cardiology

## 2023-12-03 ENCOUNTER — Other Ambulatory Visit: Payer: Self-pay | Admitting: Cardiology

## 2023-12-23 ENCOUNTER — Ambulatory Visit: Payer: PRIVATE HEALTH INSURANCE | Attending: Cardiology | Admitting: Cardiology

## 2023-12-23 ENCOUNTER — Encounter: Payer: Self-pay | Admitting: Cardiology

## 2023-12-23 VITALS — BP 102/60 | HR 79 | Ht 70.0 in | Wt 194.8 lb

## 2023-12-23 DIAGNOSIS — I4821 Permanent atrial fibrillation: Secondary | ICD-10-CM | POA: Diagnosis present

## 2023-12-23 DIAGNOSIS — E669 Obesity, unspecified: Secondary | ICD-10-CM

## 2023-12-23 DIAGNOSIS — I10 Essential (primary) hypertension: Secondary | ICD-10-CM | POA: Diagnosis not present

## 2023-12-23 DIAGNOSIS — E1169 Type 2 diabetes mellitus with other specified complication: Secondary | ICD-10-CM

## 2023-12-23 DIAGNOSIS — I2510 Atherosclerotic heart disease of native coronary artery without angina pectoris: Secondary | ICD-10-CM | POA: Diagnosis not present

## 2023-12-23 DIAGNOSIS — R55 Syncope and collapse: Secondary | ICD-10-CM

## 2023-12-23 DIAGNOSIS — E785 Hyperlipidemia, unspecified: Secondary | ICD-10-CM

## 2023-12-23 MED ORDER — LOSARTAN POTASSIUM 25 MG PO TABS: 25.0000 mg | ORAL_TABLET | Freq: Every day | ORAL | 3 refills | Status: DC

## 2023-12-23 NOTE — Patient Instructions (Signed)
 Medication Instructions:  Your physician has recommended you make the following change in your medication:  STOP Hydrochlorothiazide  DECREASE Losartan  to 25 mg daily  *If you need a refill on your cardiac medications before your next appointment, please call your pharmacy*  Lab Work: None ordered  If you have any lab test that is abnormal or we need to change your treatment, we will call you to review the results.  Testing/Procedures: None ordered  Follow-Up: At [facility], you and your health needs are our priority.  As part of our continuing mission to provide you with exceptional heart care, our providers are all part of one team.  This team includes your primary Cardiologist (physician) and Advanced Practice Providers or APPs (Physician Assistants and Nurse Practitioners) who all work together to provide you with the care you need, when you need it.  Your next appointment:   2 month(s)  Provider:   Josefa Beauvais, NP or Lum Louis, NP      Then, Alm Clay, MD will plan to see you again in 1 year(s).    Thank you for choosing [facility]!!   [phone]   Other Instructions  INCREASE your hydration - add 2 more glasses a day to your current regimen.  One of those extra glasses should be a hydration drink

## 2023-12-23 NOTE — Progress Notes (Signed)
 Cardiology Office Note:  .   Date:  01/02/2024  ID:  Chris Wilcox, DOB 08-08-1955, MRN [id number] PCP: Chris Mix, MD   HeartCare Providers Cardiologist:  Alm Clay, MD     Chief Complaint  Patient presents with   Follow-up    Annual follow-up.  Doing well.  1 episode of syncope with low blood pressure.  Has been having lowish blood pressures.   Coronary Artery Disease    No angina    Patient Profile: .     Chris Wilcox is a very pleasant 68 y.o. male with a PMH noted below who presents here for annual follow-up at the request of Chris Mix, MD.  PMH: Single-vessel CAD (non-STEMI ~2010 found to have occluded RCA), Permanent A. Fib, HTN, HLD, and  DM-2 along with recurrent Nephrolithiasis   Chris Wilcox is very active involved in sports.  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  Also does strength and conditioning exercises and walks most days.  He had a grand son morning shortly before his visit in November 2023    Chris Wilcox was last seen on 12/24/2022: Mild end of day swelling and resolves with elevation.  But no angina or heart failure symptoms.  Most most limited by back pain.  He was seen at [facility] for syncopal episode with hypotension that occurred after eating lunch.  He became dizzy and felt he was in a vomit.  He got up to the bathroom and must of passed out.  On EMS arrival his blood pressures were 68/40 and 71/42.  Remained in A-fib.  Blood sugars were between 90s.  Apparently had lost 40 pounds in the past year on GLP-1 agonist, and BP meds have not been adjusted.  He was given a bolus of IV fluid as his labs showed dehydration.  He was told to hydrate.  Avoid NSAIDs.  His losartan  dose was cut in half and was told to hold his HCTZ.  Subjective  Discussed the use of AI scribe software for clinical note transcription with the patient, who gave verbal consent to proceed.  History of Present Illness Chris Wilcox is a 68  year old male with atrial fibrillation and diabetes who presents with a recent syncopal episode.  He experienced a syncopal episode yesterday while attempting to eat lunch. Prior to losing consciousness, he felt suddenly hot and sweaty, with a sensation of impending vomiting. He has lost 40 pounds since his last visit, now weighing 194 pounds, down from 235 pounds.  He has a history of atrial fibrillation and is currently on diltiazem  240 mg, metoprolol  25 mg twice a day, losartan  50 mg, and Xarelto . He reported a recent blood pressure measurement of 92/62 mmHg at a urology follow-up. He also takes Lipitor 40 mg and Zetia  10 mg for cholesterol management.  He has diabetes, with a recent A1c of 8.3% in September. He is on Ozempic 2 mg weekly, metformin twice a day, and Jardiance. Despite significant weight loss, his blood sugars remain high, with recent labs showing elevated levels. He reports increased urination since his kidney stone removal.  He experiences dizziness and lightheadedness, particularly when his blood pressure is low. No chest pain, shortness of breath, or heart palpitations. Occasional swelling around his socks resolves overnight. He has not been taking aspirin , as he is on Xarelto .  He is actively coaching softball for young teenagers and has been involved in this activity for a long time. He acknowledges not  drinking enough fluids. He uses a home blood pressure monitor to track his readings.   Cardiovascular ROS: no chest pain or dyspnea on exertion positive for - loss of consciousness and this is described above.  Also notes continued weight loss.  Some lightheadedness and dizziness when blood pressure is low.  Minimal lower extremity maintained in the day. negative for - irregular heartbeat, orthopnea, palpitations, paroxysmal nocturnal dyspnea, rapid heart rate, shortness of breath, or TIA/amaurosis fugax, claudication     Objective   Current Meds Medication Sig    atorvastatin  (LIPITOR) 40 MG tablet TAKE 1 TABLET BY MOUTH EVERY DAY   diltiazem  (CARDIZEM  CD) 240 MG 24 hr capsule Take 1 capsule (240 mg total) by mouth daily.   ezetimibe  (ZETIA ) 10 MG tablet TAKE 1 TABLET BY MOUTH EVERY DAY   GLIPIZIDE PO Take 1 tablet by mouth daily.   JARDIANCE 10 MG TABS tablet Take 10 mg by mouth daily.   metFORMIN (GLUCOPHAGE) 500 MG tablet Take 500 mg by mouth in the morning and at bedtime.   metoprolol  tartrate (LOPRESSOR ) 50 MG tablet TAKE 1/2 TABLETS (25 MG TOTAL) BY MOUTH 2 (TWO) TIMES DAILY.   nitroGLYCERIN  (NITROSTAT ) 0.4 MG SL tablet Place 0.4 mg under the tongue every 5 (five) minutes as needed for chest pain.   omega-3 fish oil (MAXEPA) 1000 MG CAPS capsule Take 3 capsules by mouth daily. Patient taking 2 capsules   OZEMPIC, 2 MG/DOSE, 8 MG/3ML SOPN Inject 2 mg into the skin once a week.   XARELTO  20 MG TABS tablet TAKE 1 TABLET BY MOUTH DAILY WITH SUPPER   [DISCONTINUED] hydrochlorothiazide  (HYDRODIURIL ) 25 MG tablet TAKE 1 TABLET BY MOUTH EVERY DAY   [DISCONTINUED] losartan  (COZAAR ) 100 MG tablet TAKE 1 TABLET BY MOUTH EVERY DAY    Medication Sig   Acetaminophen  (TYLENOL  EXTRA STRENGTH PO) Take 1 tablet by mouth as needed (takes 1-2 as needed, usually in the afternoon.).   methocarbamol  (ROBAXIN ) 500 MG tablet Take 1 tablet (500 mg total) by mouth every 6 (six) hours as needed for muscle spasms. (Patient taking differently: Take 500 mg by mouth 2 (two) times daily.)   tamsulosin (FLOMAX) 0.4 MG CAPS capsule Take 0.4 mg by mouth.   traMADol  (ULTRAM ) 50 MG tablet Take 1-2 tablets (50-100 mg total) by mouth every 6 (six) hours as needed (mild pain). (Patient taking differently: Take 50-100 mg by mouth 2 (two) times daily.)    Studies Reviewed: SABRA   EKG Interpretation Date/Time:  Wednesday December 23 2023 09:50:10 EST Ventricular Rate:  79 PR Interval:    QRS Duration:  90 QT Interval:  382 QTC Calculation: 438 R Axis:   72  Text Interpretation: Atrial  fibrillation Low voltage QRS Septal infarct (cited on or before 21-Jan-2007) When compared with ECG of 26-Dec-2022 09:16, No significant change was found Confirmed by Anner Lenis ([id number]) on 12/23/2023 10:11:20 AM   Labs from 12/22/2023: WBC 8.9, Hgb 15.2, PLT 217; NA 130, BUN 30, creatinine 1.6.  Glucose 242.  Alk phos 140 but normal AST and ALT 10/29/2023: A1c 8.3 07/15/2023: TC 111, TG 90, HDL 36 and LDL 62  No recent studies  Risk Assessment/Calculations:    CHA2DS2-VASc Score =     This indicates a  % annual risk of stroke. The patient's score is based upon:           Physical Exam:   VS:  BP 102/60 (BP Location: Left Arm, Patient Position: Sitting, Cuff Size: Normal)  Pulse 79   Ht 5' 10 (1.778 m)   Wt 194 lb 12.8 oz (88.4 kg)   SpO2 97%   BMI 27.95 kg/m    Wt Readings from Last 3 Encounters:  12/23/23 194 lb 12.8 oz (88.4 kg)  12/26/22 198 lb (89.8 kg)  12/09/21 203 lb (92.1 kg)      GEN: Well nourished, well groomed; in no acute distress; otherwise healthy appearing. NECK: No JVD; No carotid bruits CARDIAC: Irregularly irregular, Normal S1, S2; no murmurs, rubs, gallops RESPIRATORY:  Clear to auscultation without rales, wheezing or rhonchi ; nonlabored, good air movement. ABDOMEN: Soft, non-tender, non-distended EXTREMITIES:  No edema; No deformity      ASSESSMENT AND PLAN: .    Problem List Items Addressed This Visit       Cardiology Problems   Coronary artery disease, occlusive - RCA Occlusion, previously unable to cross occlusion. (Chronic)   No angina or heart failure symptoms. Cholesterol levels well-controlled. Beta blocker and ARB beneficial for cardiovascular protection. - Continue Lipitor 40 mg daily and Zetia  10 mg daily; along with omega-3 fish oil tablets. - Continue beta blocker and ARB. => Continue Lopressor  25 mg twice daily and reduce losartan  to 25 mg daily. - Continue diltiazem  CD24 Miller daily. - Continue Ozempic 2 mg weekly and  Jardiance 10 Zio daily      Relevant Medications   losartan  (COZAAR ) 25 MG tablet   Other Relevant Orders   EKG 12-Lead (Completed)   Essential hypertension (Chronic)   Significant trend toward lower blood pressures. Medication-induced hypotension and dehydration Recent syncope likely due to medication-induced hypotension and dehydration. Low blood pressure and elevated creatinine and BUN levels indicate dehydration. Sodium low, suggesting significant dehydration. Recent weight loss may necessitate reduced antihypertensive medication. - Continue Lopressor  (metoprolol  tartrate) 25 mg twice daily and diltiazem  CD2 140 mL daily - Discontinue hydrochlorothiazide  25 mg daily. - Reduced losartan  dose from 100 mg to 25 mg daily. - Advised home blood pressure monitoring. - Instructed to increase hydration by two glasses of water daily and use rehydration drinks. - Advised to skip losartan  if dizzy.      Relevant Medications   losartan  (COZAAR ) 25 MG tablet   Hyperlipidemia associated with type 2 diabetes mellitus (HCC) (Chronic)   Most recent labs from June showed LDL of 62.  Primus at goal on combination of atorvastatin  40 midodrine daily and Zetia  10 mg daily with omega-3 fish oil. Labs followed by PCP. -Continue current meds.   . A1c elevated at 8.3% despite weight loss. Current regimen includes Ozempic, metformin, and Jardiance. Discussed potential insulin therapy due to insulin resistance. Consider reducing Ozempic dose as weight stabilizes. - Continue Ozempic, metformin, and Jardiance. - Consider insulin therapy if A1c remains elevated. - Discuss potential Ozempic dose reduction with primary care provider.      Relevant Medications   losartan  (COZAAR ) 25 MG tablet   Permanent atrial fibrillation (HCC): CHA2DS2-VASc Score 3 (CAD, DM-2 HTN) - Primary (Chronic)   Chronic atrial fibrillation managed with diltiazem  CD2 140 mg daily and metoprolol  tartrate 25 mg twice daily. No recent  palpitations or skipped beats. - Continue diltiazem  and metoprolol  at current doses. - Continue Xarelto  20 mg daily      Relevant Medications   losartan  (COZAAR ) 25 MG tablet   Other Relevant Orders   EKG 12-Lead (Completed)     Other   Obesity (BMI 30-39.9) (Chronic)   Significant weight loss of 40 pounds. Current weight 194 pounds. No longer  obese.  Significant weight loss on Ozempic. Weight loss impacts antihypertensive medication needs.  As result, were able to reduce his BP medicines. - Continue monitoring weight and adjust medications as needed.        Syncope and collapse   1 episode of syncope most likely related to dehydration and vasovagal response.  Stressed importance of adequate hydration, will stop HCTZ and reduce losartan  dose. Hold losartan  if he feels dizzy or lightheaded.           Follow-Up: Return in about 2 months (around 02/22/2024) for Follow-up with APP, 1 Yr Follow-up, Routine follow up with me.  I spent 43 minutes in the care of Eva Clute today including reviewing outside labs from PCP (1 minute), reviewing studies (3 minutes reviewing his prior cardiac studies), face to face time discussing treatment options (23), reviewing records from Recent ER visit (5 minutes), 11 minutes dictating, and documenting in the encounter.      Signed, Alm MICAEL Clay, MD, MS Alm Clay, M.D., M.S. Interventional Cardiologist  [facility] Pager # [phone]

## 2024-01-02 ENCOUNTER — Encounter: Payer: Self-pay | Admitting: Cardiology

## 2024-01-02 DIAGNOSIS — R55 Syncope and collapse: Secondary | ICD-10-CM | POA: Insufficient documentation

## 2024-01-02 NOTE — Assessment & Plan Note (Signed)
 No angina or heart failure symptoms. Cholesterol levels well-controlled. Beta blocker and ARB beneficial for cardiovascular protection. - Continue Lipitor 40 mg daily and Zetia  10 mg daily; along with omega-3 fish oil tablets. - Continue beta blocker and ARB. => Continue Lopressor  25 mg twice daily and reduce losartan  to 25 mg daily. - Continue diltiazem  CD24 Miller daily. - Continue Ozempic 2 mg weekly and Jardiance 10 Zio daily

## 2024-01-02 NOTE — Assessment & Plan Note (Signed)
 1 episode of syncope most likely related to dehydration and vasovagal response.  Stressed importance of adequate hydration, will stop HCTZ and reduce losartan  dose. Hold losartan  if he feels dizzy or lightheaded.

## 2024-01-02 NOTE — Assessment & Plan Note (Signed)
 Significant trend toward lower blood pressures. Medication-induced hypotension and dehydration Recent syncope likely due to medication-induced hypotension and dehydration. Low blood pressure and elevated creatinine and BUN levels indicate dehydration. Sodium low, suggesting significant dehydration. Recent weight loss may necessitate reduced antihypertensive medication. - Continue Lopressor  (metoprolol  tartrate) 25 mg twice daily and diltiazem  CD2 140 mL daily - Discontinue hydrochlorothiazide  25 mg daily. - Reduced losartan  dose from 100 mg to 25 mg daily. - Advised home blood pressure monitoring. - Instructed to increase hydration by two glasses of water daily and use rehydration drinks. - Advised to skip losartan  if dizzy.

## 2024-01-02 NOTE — Assessment & Plan Note (Addendum)
 Significant weight loss of 40 pounds. Current weight 194 pounds. No longer obese.  Significant weight loss on Ozempic. Weight loss impacts antihypertensive medication needs.  As result, were able to reduce his BP medicines. - Continue monitoring weight and adjust medications as needed.

## 2024-01-02 NOTE — Assessment & Plan Note (Signed)
 Chronic atrial fibrillation managed with diltiazem  CD2 140 mg daily and metoprolol  tartrate 25 mg twice daily. No recent palpitations or skipped beats. - Continue diltiazem  and metoprolol  at current doses. - Continue Xarelto  20 mg daily

## 2024-01-02 NOTE — Assessment & Plan Note (Addendum)
 Most recent labs from June showed LDL of 62.  Primus at goal on combination of atorvastatin  40 midodrine daily and Zetia  10 mg daily with omega-3 fish oil. Labs followed by PCP. -Continue current meds.   . A1c elevated at 8.3% despite weight loss. Current regimen includes Ozempic, metformin, and Jardiance. Discussed potential insulin therapy due to insulin resistance. Consider reducing Ozempic dose as weight stabilizes. - Continue Ozempic, metformin, and Jardiance. - Consider insulin therapy if A1c remains elevated. - Discuss potential Ozempic dose reduction with primary care provider.

## 2024-02-12 ENCOUNTER — Other Ambulatory Visit: Payer: Self-pay | Admitting: Cardiology

## 2024-02-12 DIAGNOSIS — I4821 Permanent atrial fibrillation: Secondary | ICD-10-CM

## 2024-02-12 NOTE — Telephone Encounter (Signed)
 Prescription refill request for Xarelto  received.  Indication: A-Fib Last office visit: 12/23/23 Weight: 88.4 KG Age: 69 Scr: 1.00 01/29/24 Care Everywhere CrCl: 53mL/min Pt has Passed Parameters

## 2024-02-24 ENCOUNTER — Encounter: Payer: Self-pay | Admitting: *Deleted

## 2024-02-24 ENCOUNTER — Other Ambulatory Visit: Payer: Self-pay | Admitting: Cardiology

## 2024-02-25 ENCOUNTER — Ambulatory Visit: Admitting: Emergency Medicine

## 2024-02-25 ENCOUNTER — Encounter: Payer: Self-pay | Admitting: Emergency Medicine

## 2024-02-25 VITALS — BP 120/72 | HR 82 | Ht 70.0 in | Wt 198.0 lb

## 2024-02-25 DIAGNOSIS — E785 Hyperlipidemia, unspecified: Secondary | ICD-10-CM | POA: Diagnosis not present

## 2024-02-25 DIAGNOSIS — R55 Syncope and collapse: Secondary | ICD-10-CM | POA: Insufficient documentation

## 2024-02-25 DIAGNOSIS — I4821 Permanent atrial fibrillation: Secondary | ICD-10-CM | POA: Diagnosis not present

## 2024-02-25 DIAGNOSIS — Z79899 Other long term (current) drug therapy: Secondary | ICD-10-CM | POA: Insufficient documentation

## 2024-02-25 DIAGNOSIS — R600 Localized edema: Secondary | ICD-10-CM | POA: Insufficient documentation

## 2024-02-25 DIAGNOSIS — I10 Essential (primary) hypertension: Secondary | ICD-10-CM | POA: Insufficient documentation

## 2024-02-25 DIAGNOSIS — I2510 Atherosclerotic heart disease of native coronary artery without angina pectoris: Secondary | ICD-10-CM | POA: Insufficient documentation

## 2024-02-25 MED ORDER — HYDROCHLOROTHIAZIDE 12.5 MG PO CAPS: 12.5000 mg | ORAL_CAPSULE | Freq: Every day | ORAL | 3 refills | Status: DC

## 2024-02-25 MED ORDER — LOSARTAN POTASSIUM 25 MG PO TABS: 12.5000 mg | ORAL_TABLET | Freq: Every day | ORAL | 3 refills | Status: AC

## 2024-02-25 MED ORDER — LOSARTAN POTASSIUM 25 MG PO TABS: 12.5000 mg | ORAL_TABLET | Freq: Every morning | ORAL | 3 refills | Status: DC

## 2024-02-25 MED ORDER — HYDROCHLOROTHIAZIDE 12.5 MG PO CAPS: 12.5000 mg | ORAL_CAPSULE | Freq: Every morning | ORAL | 3 refills | Status: AC

## 2024-02-25 NOTE — Patient Instructions (Signed)
 Medication Instructions:  DECREASE LOSARTAN  TO 12.5 MG (1/2 TABLET) AT NIGHT. START TAKING HYDROCHLOROTHIAZIDE  12.5 MG (1/2 TABLET) IN THE MORNING.   Lab Work: BMET TO BE DONE IN 2 WEEKS. (AROUND 03-10-24)  Testing/Procedures: NONE  Follow-Up: At [facility], you and your health needs are our priority.  As part of our continuing mission to provide you with exceptional heart care, our providers are all part of one team.  This team includes your primary Cardiologist (physician) and Advanced Practice Providers or APPs (Physician Assistants and Nurse Practitioners) who all work together to provide you with the care you need, when you need it.  Your next appointment:   3 MONTHS  Provider:   Alm Clay, MD OR Lum Louis, DNP   We recommend signing up for the patient portal called MyChart.  Sign up information is provided on this After Visit Summary.  MyChart is used to connect with patients for Virtual Visits (Telemedicine).  Patients are able to view lab/test results, encounter notes, upcoming appointments, etc.  Non-urgent messages can be sent to your provider as well.   To learn more about what you can do with MyChart, go to [email or web address].   Other Instructions:

## 2024-02-25 NOTE — Progress Notes (Signed)
 " Cardiology Office Note:    Date:  02/25/2024  ID:  Chris Wilcox, DOB July 13, 1955, MRN [id number] PCP: Rosan Mix, MD  [facility] Providers Cardiologist:  Alm Clay, MD Cardiology APP:  Rana Lum CROME, NP       Patient Profile:       Chief Complaint: 78-month follow-up History of Present Illness:  Chris Wilcox is a 69 y.o. male with visit-pertinent history of atrial fibrillation, coronary artery disease, hypertension, hyperlipidemia and diabetes  He has history of single-vessel coronary artery disease with occluded RCA.  Last seen in clinic on 12/23/2023 by Dr. Clay.  He presented with an episode of recent syncope.  He was seen at [facility] for syncopal episode with hypotension that occurred after eating lunch.  He became dizzy and he got up to the bathroom and passed out.  On EMS arrival his blood pressures were 68/40 and 71/42.  He remained in A-fib.  Apparently he had lost 40 pounds in the past year on a GLP-1 agonist.  His blood pressure meds had been adjusted.  He was given a bolus of IV fluid and his labs showed dehydration.  His losartan  dose was cut in half and he was told to hold his hydrochlorothiazide .  In follow-up with Dr. Clay he was doing well without anginal or heart failure symptoms.  His hydrochlorothiazide  was discontinued and losartan  dose decreased from 100 to 25 mg daily.  Syncopal episode was thought to be due to dehydration and vasovagal response.  Discussed the use of AI scribe software for clinical note transcription with the patient, who gave verbal consent to proceed.  History of Present Illness Chris Wilcox is a 69 year old male with hypertension and coronary artery disease who presents for 9-month follow-up after medication changes.  Today he denies any further episodes of syncope or episodes of hypotension.  He is following up after medication changes made after an episode of syncope, when hydrochlorothiazide  was stopped  and losartan  was decreased from 100 mg to 25 mg. Since then he has had increased daytime leg swelling, more in one leg and described as tight, which improves some with leg elevation at night.  He is without any symptoms of his permanent atrial fibrillation reports his heart rate is well-controlled at home.  Remains adherent to his Xarelto .  He has diabetes and a family history of diabetes. A diabetes specialist recently recommended switching from Ozempic to Mounjaro and increasing glipizide from one to two pills for better glycemic control.  He was hospitalized for three days with pneumonia shortly after Christmas. 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  He lives in [place] and remains active in work and coaching without cardiopulmonary limitation.  He denies orthopnea, PND, palpitations, syncope, presyncope    Review of systems:  Please see the history of present illness. All other systems are reviewed and otherwise negative.      Studies Reviewed:    EKG Interpretation Date/Time:  Thursday February 25 2024 08:04:02 EST Ventricular Rate:  82 PR Interval:    QRS Duration:  84 QT Interval:  356 QTC Calculation: 415 R Axis:   73  Text Interpretation: Atrial fibrillation Low voltage QRS Septal infarct (cited on or before 21-Jan-2007) When compared with ECG of 23-Dec-2023 09:50, Nonspecific T wave abnormality has replaced inverted T waves in Inferior leads No significant change  was found Confirmed by Rana [facility] [phone]) on 02/25/2024 12:37:40 PM    - No recent studies   Risk Assessment/Calculations:    CHA2DS2-VASc Score = 4   This indicates a 4.8% annual risk of stroke. The patient's score is based upon: CHF History: 0 HTN History: 1 Diabetes History: 1 Stroke History: 0 Vascular Disease History: 1 Age Score: 1 Gender Score: 0             Physical Exam:    VS:  BP 120/72 (BP Location: Left Arm, Patient Position: Sitting, Cuff Size: Normal)   Pulse 82   Ht 5' 10 (1.778 m)   Wt 198 lb (89.8 kg)   BMI 28.41 kg/m    Wt Readings from Last 3 Encounters:  02/25/24 198 lb (89.8 kg)  12/23/23 194 lb 12.8 oz (88.4 kg)  12/26/22 198 lb (89.8 kg)    GEN: Well nourished, well developed in no acute distress NECK: No JVD; No carotid bruits CARDIAC: Irregularly irregular rhythm, no murmurs, rubs, gallops RESPIRATORY:  Clear to auscultation without rales, wheezing or rhonchi  ABDOMEN: Soft, non-tender, non-distended EXTREMITIES: 1+ bilateral LEE; No acute deformity      Assessment and Plan:  Coronary artery disease History of single-vessel CAD with occluded RCA - Today patient is stable without chest pains.  No symptoms to suggest angina.  No indication of further ischemic evaluation at this time - No acute ST/T wave changes on EKG - Continue atorvastatin  40 mg daily and ezetimibe  10 mg daily - Continue metoprolol  tartrate 25 mg twice daily and reduce losartan  to 12.5 mg daily - He is considering switching from Ozempic to Mounjaro for better glycemic control  Lower extremity edema Increased edema since discontinuation of hydrochlorothiazide  on 01-03-24. Swelling more pronounced during the day.  He is without any symptoms of heart failure and denies dyspnea, orthopnea, PND, weight gain - Restarted hydrochlorothiazide  at 12.5 mg daily which is half his prior dose - Monitor for improvement in edema - Consider echocardiogram if edema persists after medication adjustment although permanent atrial fibrillation will not get a good look at his diastolic function - Maintain adequate hydration  Hypertension Recent significant trend towards lower blood pressures with history syncope due to medication induced hypotension and dehydration with recent discontinuation of hydrochlorothiazide  and reduction of losartan  on January 03, 2024 - Today he does report increase in  leg swelling since discontinuation of hydrochlorothiazide  which has been most bothersome for him.  He does deny any dyspnea, orthopnea, PND or heart failure symptoms - Will reintroduce hydrochlorothiazide  at half his prior dose at 12.5 mg daily and decrease his losartan  from 25 to 12.5 mg daily - Continue diltiazem  240 mg daily and metoprolol  tartrate 25 mg twice daily - Instructed to maintain adequate hydration - BMET x 2 weeks  Hyperlipidemia LDL 62 on 07/2023 and well-controlled - Continue atorvastatin  40 mg daily and ezetimibe  10 mg daily with omega-3 fish oil  Permanent atrial fibrillation He is currently unaware of his arrhythmia.  He is without any palpitations, skipped beats, or tachycardia - Continue to diltiazem  240 mg daily and metoprolol  tartrate 25 mg twice daily - Continue Xarelto  20 mg daily, no bleeding concerns  Syncope History of syncope most likely related to dehydration and vasovagal response - No further episodes of syncope or presyncope since medication changes - We discussed maintaining adequate hydration      Dispo:  Return in about 3 months (around 05/25/2024).  Signed, [facility] LITTIE Rana, NP  "

## 2024-05-25 ENCOUNTER — Ambulatory Visit: Admitting: Emergency Medicine
